# Patient Record
Sex: Female | Born: 1980 | Race: White | Hispanic: No | Marital: Single | Smoking: Never smoker
Health system: Southern US, Community
[De-identification: ages and names within clinical notes are randomized; demographics above are authoritative.]

## PROBLEM LIST (undated history)

## (undated) DIAGNOSIS — F411 Generalized anxiety disorder: Secondary | ICD-10-CM

## (undated) DIAGNOSIS — B3731 Acute candidiasis of vulva and vagina: Secondary | ICD-10-CM

## (undated) DIAGNOSIS — N3281 Overactive bladder: Secondary | ICD-10-CM

## (undated) DIAGNOSIS — I10 Essential (primary) hypertension: Secondary | ICD-10-CM

## (undated) DIAGNOSIS — G809 Cerebral palsy, unspecified: Secondary | ICD-10-CM

## (undated) DIAGNOSIS — G43909 Migraine, unspecified, not intractable, without status migrainosus: Secondary | ICD-10-CM

## (undated) DIAGNOSIS — M419 Scoliosis, unspecified: Secondary | ICD-10-CM

## (undated) DIAGNOSIS — B373 Candidiasis of vulva and vagina: Secondary | ICD-10-CM

## (undated) DIAGNOSIS — N809 Endometriosis, unspecified: Secondary | ICD-10-CM

## (undated) DIAGNOSIS — G8190 Hemiplegia, unspecified affecting unspecified side: Secondary | ICD-10-CM

## (undated) DIAGNOSIS — N3941 Urge incontinence: Secondary | ICD-10-CM

## (undated) DIAGNOSIS — F329 Major depressive disorder, single episode, unspecified: Secondary | ICD-10-CM

## (undated) DIAGNOSIS — G629 Polyneuropathy, unspecified: Secondary | ICD-10-CM

## (undated) DIAGNOSIS — R569 Unspecified convulsions: Secondary | ICD-10-CM

## (undated) DIAGNOSIS — N3000 Acute cystitis without hematuria: Secondary | ICD-10-CM

## (undated) DIAGNOSIS — E569 Vitamin deficiency, unspecified: Secondary | ICD-10-CM

## (undated) DIAGNOSIS — K5900 Constipation, unspecified: Secondary | ICD-10-CM

## (undated) DIAGNOSIS — K589 Irritable bowel syndrome without diarrhea: Secondary | ICD-10-CM

## (undated) DIAGNOSIS — G4700 Insomnia, unspecified: Secondary | ICD-10-CM

## (undated) DIAGNOSIS — IMO0001 Reserved for inherently not codable concepts without codable children: Secondary | ICD-10-CM

## (undated) DIAGNOSIS — F419 Anxiety disorder, unspecified: Secondary | ICD-10-CM

## (undated) DIAGNOSIS — F410 Panic disorder [episodic paroxysmal anxiety] without agoraphobia: Secondary | ICD-10-CM

## (undated) DIAGNOSIS — M6281 Muscle weakness (generalized): Secondary | ICD-10-CM

## (undated) DIAGNOSIS — K219 Gastro-esophageal reflux disease without esophagitis: Secondary | ICD-10-CM

## (undated) DIAGNOSIS — R339 Retention of urine, unspecified: Secondary | ICD-10-CM

## (undated) DIAGNOSIS — I639 Cerebral infarction, unspecified: Secondary | ICD-10-CM

## (undated) HISTORY — DX: Acute candidiasis of vulva and vagina: B37.31

## (undated) HISTORY — DX: Generalized anxiety disorder: F41.1

## (undated) HISTORY — DX: Essential (primary) hypertension: I10

## (undated) HISTORY — DX: Irritable bowel syndrome, unspecified: K58.9

## (undated) HISTORY — DX: Candidiasis of vulva and vagina: B37.3

## (undated) HISTORY — DX: Hemiplegia, unspecified affecting unspecified side: G81.90

## (undated) HISTORY — DX: Retention of urine, unspecified: R33.9

## (undated) HISTORY — PX: ESOPHAGOGASTRODUODENOSCOPY: SHX1529

## (undated) HISTORY — DX: Acute cystitis without hematuria: N30.00

## (undated) HISTORY — DX: Gastro-esophageal reflux disease without esophagitis: K21.9

---
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
## 2006-09-06 HISTORY — PX: OTHER SURGICAL HISTORY: SHX169

## 2014-09-06 HISTORY — PX: CORONARY ANGIOPLASTY: SHX604

## 2014-10-03 LAB — COMPREHENSIVE METABOLIC PANEL
ALT: 18 U/L (ref 14–63)
ANION GAP: 9 (ref 7–16)
Albumin: 3.7 g/dL (ref 3.4–5.0)
Alkaline Phosphatase: 102 U/L (ref 46–116)
BUN: 13 mg/dL (ref 7–18)
Bilirubin,Total: 0.4 mg/dL (ref 0.2–1.0)
CHLORIDE: 112 mmol/L — AB (ref 98–107)
CO2: 19 mmol/L — AB (ref 21–32)
CREATININE: 0.68 mg/dL (ref 0.60–1.30)
Calcium, Total: 8.6 mg/dL (ref 8.5–10.1)
EGFR (Non-African Amer.): >60
Glucose: 87 mg/dL (ref 65–99)
OSMOLALITY: 279 (ref 275–301)
POTASSIUM: 3.7 mmol/L (ref 3.5–5.1)
SGOT(AST): 10 U/L — ABNORMAL LOW (ref 15–37)
Sodium: 140 mmol/L (ref 136–145)
TOTAL PROTEIN: 7.3 g/dL (ref 6.4–8.2)

## 2014-10-03 LAB — CBC
HCT: 39.1 % (ref 35.0–47.0)
HGB: 13 g/dL (ref 12.0–16.0)
MCH: 28.6 pg (ref 26.0–34.0)
MCHC: 33.1 g/dL (ref 32.0–36.0)
MCV: 86 fL (ref 80–100)
PLATELETS: 248 10*3/uL (ref 150–440)
RBC: 4.53 10*6/uL (ref 3.80–5.20)
RDW: 14.4 % (ref 11.5–14.5)
WBC: 12.3 10*3/uL — ABNORMAL HIGH (ref 3.6–11.0)

## 2014-10-03 LAB — CK TOTAL AND CKMB (NOT AT ARMC)
CK, Total: 30 U/L (ref 26–192)
CK-MB: <0.5 ng/mL — ABNORMAL LOW (ref 0.5–3.6)

## 2014-10-03 LAB — TROPONIN I: Troponin-I: <0.02 ng/mL

## 2014-10-04 LAB — BASIC METABOLIC PANEL
Anion Gap: 9 (ref 7–16)
BUN: 11 mg/dL (ref 7–18)
Calcium, Total: 8.7 mg/dL (ref 8.5–10.1)
Chloride: 115 mmol/L — ABNORMAL HIGH (ref 98–107)
Co2: 16 mmol/L — ABNORMAL LOW (ref 21–32)
Creatinine: 0.48 mg/dL — ABNORMAL LOW (ref 0.60–1.30)
EGFR (African American): >60
GLUCOSE: 118 mg/dL — AB (ref 65–99)
Osmolality: 280 (ref 275–301)
Potassium: 4.2 mmol/L (ref 3.5–5.1)
SODIUM: 140 mmol/L (ref 136–145)

## 2014-10-04 LAB — TROPONIN I
Troponin-I: <0.02 ng/mL
Troponin-I: <0.02 ng/mL

## 2014-10-04 LAB — CBC WITH DIFFERENTIAL/PLATELET
COMMENT - H1-COM1: NORMAL
COMMENT - H1-COM2: NORMAL
HCT: 39 % (ref 35.0–47.0)
HGB: 12.9 g/dL (ref 12.0–16.0)
Lymphocytes: 11 %
MCH: 28.6 pg (ref 26.0–34.0)
MCHC: 33.1 g/dL (ref 32.0–36.0)
MCV: 86 fL (ref 80–100)
MONOS PCT: 4 %
Platelet: 212 10*3/uL (ref 150–440)
RBC: 4.52 10*6/uL (ref 3.80–5.20)
RDW: 13.8 % (ref 11.5–14.5)
Segmented Neutrophils: 85 %
WBC: 9.5 10*3/uL (ref 3.6–11.0)

## 2014-10-04 LAB — LIPID PANEL
CHOLESTEROL: 170 mg/dL (ref 0–200)
HDL Cholesterol: 36 mg/dL — ABNORMAL LOW (ref 40–60)
LDL CHOLESTEROL, CALC: 123 mg/dL — AB (ref 0–100)
TRIGLYCERIDES: 56 mg/dL (ref 0–200)
VLDL CHOLESTEROL, CALC: 11 mg/dL (ref 5–40)

## 2014-10-04 LAB — HCG, QUANTITATIVE, PREGNANCY: Beta Hcg, Quant.: <1 m[IU]/mL — ABNORMAL LOW

## 2014-10-06 ENCOUNTER — Inpatient Hospital Stay: Payer: Self-pay | Admitting: Internal Medicine

## 2014-10-07 LAB — HCG, QUANTITATIVE, PREGNANCY

## 2014-11-19 ENCOUNTER — Ambulatory Visit: Payer: Self-pay | Admitting: Gastroenterology

## 2014-12-07 ENCOUNTER — Other Ambulatory Visit: Admit: 2014-12-07 | Disposition: A | Discharge status: Home / Self Care | Payer: Self-pay

## 2014-12-07 LAB — COMPREHENSIVE METABOLIC PANEL
ALBUMIN: 4.1 g/dL
ANION GAP: 9 (ref 7–16)
Alkaline Phosphatase: 102 U/L
BILIRUBIN TOTAL: 0.4 mg/dL
BUN: 8 mg/dL
CALCIUM: 9.2 mg/dL
CHLORIDE: 112 mmol/L — AB
CO2: 19 mmol/L — AB
CREATININE: 0.5 mg/dL
EGFR (African American): >60
EGFR (Non-African Amer.): >60
GLUCOSE: 87 mg/dL
Potassium: 4 mmol/L
SGOT(AST): 16 U/L
SGPT (ALT): 13 U/L — ABNORMAL LOW
SODIUM: 140 mmol/L
Total Protein: 7.4 g/dL

## 2014-12-07 LAB — CBC WITH DIFFERENTIAL/PLATELET
BASOS PCT: 1 %
Basophil #: 0.1 10*3/uL (ref 0.0–0.1)
EOS PCT: 2.3 %
Eosinophil #: 0.2 10*3/uL (ref 0.0–0.7)
HCT: 42.7 % (ref 35.0–47.0)
HGB: 14.1 g/dL (ref 12.0–16.0)
LYMPHS PCT: 32.1 %
Lymphocyte #: 2.7 10*3/uL (ref 1.0–3.6)
MCH: 29.4 pg (ref 26.0–34.0)
MCHC: 32.9 g/dL (ref 32.0–36.0)
MCV: 89 fL (ref 80–100)
Monocyte #: 0.4 x10 3/mm (ref 0.2–0.9)
Monocyte %: 4.5 %
Neutrophil #: 5 10*3/uL (ref 1.4–6.5)
Neutrophil %: 60.1 %
Platelet: 177 10*3/uL (ref 150–440)
RBC: 4.79 10*6/uL (ref 3.80–5.20)
RDW: 13.9 % (ref 11.5–14.5)
WBC: 8.4 10*3/uL (ref 3.6–11.0)

## 2014-12-07 LAB — MAGNESIUM: Magnesium: 2.3 mg/dL

## 2014-12-07 LAB — IRON: IRON: 52 ug/dL

## 2014-12-07 LAB — TSH: Thyroid Stimulating Horm: 0.865 u[IU]/mL

## 2014-12-07 LAB — FOLATE: Folic Acid: 24 ng/mL

## 2014-12-30 LAB — SURGICAL PATHOLOGY

## 2015-01-05 NOTE — Discharge Summary (Signed)
PATIENT NAME:  Amanda Clay, Amanda Clay MR#:  [id number] DATE OF BIRTH:  06/05/1981  DATE OF ADMISSION:  10/06/2014 DATE OF DISCHARGE:  10/07/2014  ADMITTING DIAGNOSIS: Chest pain.   DISCHARGE DIAGNOSES: 1. Chest pain, felt to be possibly anxiety-induced, abnormal stress test with subsequent cardiac catheterization being negative.  2. Previous history of cerebrovascular accident.  3. Cerebral palsy.  4. Anxiety.  5. Depression.  6. History of pericarditis in the past.  7. Status post leg muscle lengthening surgery in the past.   CONSULTANTS: Dr. Adrian BlackwaterShaukat Khan.  PERTINENT LABORATORIES:  AND EVALUATIONS: Admitting glucose 87, BUN 13, creatinine 0.68, sodium 140, potassium 3.7, chloride 112, calcium 8.6.   LFTs are normal except AST of 11.   Troponin less than 0.02 x 3.   WBC 12.3, hemoglobin 13, platelet count 248,000.   Cardiac catheterization shows normal coronaries, normal LVEF.   Echocardiogram showed EF normal, impaired relaxation pattern of the left ventricular diastolic filling, pericardial effusion is trivial.   HOSPITAL COURSE: Please refer to the history and physical done by the admitting physician. The patient is a 34 year old, white female, with previous history of cerebral palsy, who presented with complaint of chest pain. The patient was evaluated in the ED and had a CT per PE protocol, which was negative for PE. Her EKG was nonrevealing. Her cardiac enzymes were nonrevealing. She underwent a stress test, which was abnormal; therefore, underwent a cardiac catheterization today, which was negative. We feel that her chest pain is possibly anxiety driven. She also needs a GI work-up for any GI-related causes as an outpatient. At this time, she is stable for discharge.   DISCHARGE MEDICATIONS: Nortriptyline 10 mg 2 tablets at bedtime, Topamax 75 mg at bedtime, Topamax 50 mg every morning, Zofran 4 mg q.6 h. p.r.n., gabapentin 300 one tablet  p.o. t.i.d., aspirin 81 mg 1 tablet p.o. daily,  Depo-Provera 1 IM every 3 months, tramadol 50 one tablet p.o. t.i.d. as needed, MiraLax 17 grams daily, multivitamin 1 tablet p.o. daily, Vesicare 10 daily, cyclobenzaprine 10 mg every morning as needed, propranolol 20 mg 1 tablet p.o. b.i.d., sertraline 100 mg 1 tablet p.o. b.i.d., clonazepam 0.5 at bedtime as needed, melatonin 5 mg 1 tablet p.o. at bedtime, Percocet 5/325 q.6 h. p.r.n. for pain, Prilosec 20 mg 1 tablet p.o. b.i.d.   DIET: Regular.   ACTIVITY: As tolerated.   FOLLOW-UP: With primary M.D. in 1 to 2 weeks. Follow up with GI, Dr. Servando SnareWohl, for a possible EGD.   TIME SPENT: 35 minutes.     ____________________________ Amanda ScottsShreyang H. Allena KatzPatel, MD shp:JT D: 10/07/2014 11:39:15 ET T: 10/07/2014 12:08:11 ET JOB#: [id number]  cc: Amanda Maresh H. Allena KatzPatel, MD, <Dictator>  Charise CarwinSHREYANG H Jaxston Chohan MD ELECTRONICALLY SIGNED 10/10/2014 12:36

## 2015-01-05 NOTE — H&P (Signed)
PATIENT NAME:  Amanda Clay, Amanda Clay MR#:  [id number] DATE OF BIRTH:  November 03, 1980  DATE OF ADMISSION:  10/03/2014  PRIMARY CARE PHYSICIAN: Dr. Fredrik Cove at [facility].  CHIEF COMPLAINT: Chest pressure.   HISTORY OF PRESENT ILLNESS: This is a 34 year old female with cerebral palsy, stroke with left-sided weakness. She does not walk around much. She presents with chest tightness at rest, not getting better, 6/10 in intensity, center of the chest, worse with a deep breath. Positive for nausea but no diaphoresis. Hospitalist services were contacted for further evaluation.   PAST MEDICAL HISTORY: Cerebral palsy, CVA with left-sided weakness, anxiety, depression, history of pericarditis in the past.   PAST SURGICAL HISTORY: Leg muscle lengthening surgery.   ALLERGIES: TO SULFA.   MEDICATIONS: As per prescription writer include: Aspirin 81 mg daily, clonazepam 0.5 mg once a day at bedtime, cyclobenzaprine 10 mg in the morning, Depo-Provera injection every 3 months, gabapentin 300 mg 3 times a day, melatonin 5 mg at bedtime, MiraLax 17 grams once a day, multivitamin 1 tablet daily, nortriptyline 10 mg 2 capsules at bedtime, Percocet 5/325 one tablet every 6 hours as needed for pain, Prilosec 20 mg once a day, propranolol 20 mg twice a day, Zoloft 100 mg twice a day, Topamax 25 mg and 50 mg total of 75 mg at night and 50 mg during the day, tramadol 50 mg 3 times a day, VESIcare 10 mg once a day, Zofran 4 mg every 6 hours as needed for nausea.   SOCIAL HISTORY: Currently at [facility]. No smoking. No alcohol. No drug use. Not working.   FAMILY HISTORY: Father died of lung cancer metastatic to bone; also had an MI. Mother is a smoker.   REVIEW OF SYSTEMS:  CONSTITUTIONAL: Positive for hot feeling. No fever or chills. No weakness, but from her stroke does have left-sided weakness. No weight loss. No weight gain.  EYES: She does wear glasses.  EARS, NOSE, MOUTH AND THROAT: No hearing loss. No sore  throat. No difficulty swallowing.  CARDIOVASCULAR: Positive for chest pain. No palpitations.  RESPIRATORY: No shortness of breath. No cough. No sputum. No hemoptysis.  GASTROINTESTINAL: No nausea. No vomiting. Did have diarrhea x 2 episodes today.  GENITOURINARY: No burning on urination or hematuria.  INTEGUMENT: No rashes or eruptions.  MUSCULOSKELETAL: Positive for joint pain.  NEUROLOGIC: No fainting or blackouts.  PSYCHIATRIC: Positive for anxiety and depression.  ENDOCRINE: No thyroid problems.  HEMATOLOGIC AND LYMPHATIC: No anemia, no easy bruising or bleeding.   PHYSICAL EXAMINATION:  VITAL SIGNS: Temperature 98.2, pulse 80, respirations 18, blood pressure 85/58, pulse oximetry 99% on room air.  GENERAL: No respiratory distress.  EYES: Conjunctivae and lids normal. Pupils equal, round, and reactive to light. Extraocular muscles intact. No nystagmus.  EARS, NOSE, MOUTH AND THROAT: Tympanic membranes, no erythema. Nasal mucosa: No erythema. Throat: No erythema. No exudate seen. Lips and gums: No lesions.  NECK: No JVD. No bruits. No lymphadenopathy. No thyromegaly. No thyroid nodules palpated.  RESPIRATORY: Lungs clear to auscultation. No use of accessory muscles to breathe. No rhonchi, rales, or wheeze heard. Painful with deep breath.  CHEST WALL: Pain to palpation anterior chest wall.  CARDIOVASCULAR: S1, S2 normal. No gallops, rubs, or murmurs heard. Carotid upstroke 2+ bilaterally. No bruits. Dorsalis pedis pulses 2+ bilaterally. Trace edema of the lower extremity.  ABDOMEN: Soft, nontender. No organomegaly/splenomegaly. Normoactive bowel sounds. No masses felt.  LYMPHATIC: No lymph nodes in the neck.  MUSCULOSKELETAL: No clubbing, edema, cyanosis.  SKIN: No rashes or lesions anteriorly.  PSYCHIATRIC: The patient is oriented to person, place, and time.   LABORATORY AND RADIOLOGICAL DATA: Troponin negative. White blood cell count is 12.3, hemoglobin and hematocrit 13.0 and 39.1,  platelet count of 248,000. Glucose 87, BUN 13, creatinine 0.68, sodium 140, potassium 3.7, chloride 112, CO2 of 19, calcium 8.6. Liver function tests normal range. Chest x-ray negative.   EKG: Flipped T waves V1 through V4.   ASSESSMENT AND PLAN:  1.  Chest pain: Worse with a deep breath also worse with palpation over chest wall. Since the patient does not walk around, we will get a CT scan of the chest to rule out pulmonary embolism. This could also be chest wall syndrome, so I will give up couple doses of intravenous steroids and see if it improves by tomorrow. We will admit as an observation, get serial cardiac enzymes, put on telemetry. The patient does have a history of pericarditis, a little less likely, but I will get an echocardiogram in the a.m.  2.  Cerebral palsy: The patient able to answer all questions and very knowledgeable.  3.  Anxiety, depression: On numerous medications.  4.  Cerebrovascular accident with left-sided weakness: Does not walk around much, but hoping to get stronger so she can get back to living in assisted living.   CODE STATUS: The patient is a full code.  TIME SPENT ON ADMISSION: 55 minutes.    ____________________________ Herschell Dimesichard J. Renae GlossWieting, MD rjw:bm D: 10/03/2014 22:10:29 ET T: 10/03/2014 22:56:29 ET JOB#: [id number]  cc: Herschell Dimesichard J. Renae GlossWieting, MD, <Dictator> [facility] Salley ScarletICHARD J Cynthis Purington MD ELECTRONICALLY SIGNED 10/04/2014 14:46

## 2015-01-05 NOTE — Consult Note (Signed)
PATIENT NAME:  Amanda Clay, Amanda Clay MR#:  [id number] DATE OF BIRTH:  1980/11/16  DATE OF CONSULTATION:  10/04/2014  REFERRING PHYSICIAN:   CONSULTING PHYSICIAN:  Laurier NancyShaukat A. Nealy Hickmon, MD  INDICATION FOR CONSULTATION: Chest pain, abnormal stress test.   HISTORY OF PRESENT ILLNESS: This is a 34 year old white female with a past medical history of cerebral palsy, history of CVA with left-sided weakness recently. She had a cardiac catheterization apparently she says in [place], she was told that she had a blockage, she does not know the details. She presented now with chest pain 6 out of 10, associated with shortness of breath and diaphoresis. She underwent a Lexiscan Myoview which showed initial EKG sinus rhythm, 70 beats per minute with nonspecific T wave changes anterolaterally, during Lexiscan infusion her heart rate shot up to 151 with 1.5 mm horizontal ST depression inferolaterally. Nuclear scan showed ejection fraction 41%, mild anterior wall reversible defect. I came to see the patient. She is still having intermittent chest.  I discussed with Dr. Allena Katzpatel  it was decided that we will keep the patient and schedule her for cardiac catheterization Monday.   PAST MEDICAL HISTORY: History of cerebral palsy, history of CVA with left-sided weakness, anxiety, depression, history of pericarditis,   ALLERGIES: SULFA.   SOCIAL HISTORY: No history of EtOH abuse or smoking.   FAMILY HISTORY: Positive for cancer and an MI and coronary artery disease in the parents.   PHYSICAL EXAMINATION:  GENERAL: She is alert and oriented x 3, in no acute distress right now.  VITAL SIGNS: Stable.  HEENT: No JVD.  LUNGS: Clear.  HEART: Regular rate and rhythm. Normal S1, S2. No audible murmur.  ABDOMEN: Soft, nontender, positive bowel sounds.  EXTREMITIES: No pedal edema.  NEUROLOGIC: She appears to be intact.   EKG shows normal sinus rhythm.   LABORATORY DATA: Shows BUN of 11, creatinine 0.48. Troponin is negative.  HCG is negative.   ASSESSMENT AND PLAN: The patient has atypical chest pain, however had stroke in the past and probably is high risk even though she is 34. Stress tests, the scan was low to moderate risk with mild anterior wall reversible defect, but the patient had significant ST depressions during Lexiscan with 1.5 mm ST depression inferolaterally, still having intermittent chest pain. It was discussed to do CCTA at the office, but it will difficult for her to go to the office with history of CVA. Advise keeping the patient over the weekend and do cardiac catheterization Monday.     ____________________________ Laurier NancyShaukat A. Loann Chahal, MD sak:bu D: 10/04/2014 14:53:15 ET T: 10/04/2014 15:21:30 ET JOB#: [id number]  cc: Laurier NancyShaukat A. Catori Panozzo, MD, <Dictator> Laurier NancySHAUKAT A Rosie Golson MD ELECTRONICALLY SIGNED 10/05/2014 11:45

## 2015-01-18 ENCOUNTER — Other Ambulatory Visit
Admission: RE | Admit: 2015-01-18 | Discharge: 2015-01-18 | Disposition: A | Discharge status: Home / Self Care | Payer: Medicaid Other | Source: Other Acute Inpatient Hospital | Attending: Physician Assistant | Admitting: Physician Assistant

## 2015-01-18 DIAGNOSIS — N3281 Overactive bladder: Secondary | ICD-10-CM | POA: Diagnosis present

## 2015-01-18 DIAGNOSIS — N3941 Urge incontinence: Secondary | ICD-10-CM | POA: Diagnosis present

## 2015-01-18 LAB — URINALYSIS COMPLETE WITH MICROSCOPIC (ARMC ONLY)
Bilirubin Urine: NEGATIVE
Glucose, UA: NEGATIVE mg/dL
Nitrite: NEGATIVE
PH: 7 (ref 5.0–8.0)
Protein, ur: 30 mg/dL — AB
Specific Gravity, Urine: 1.013 (ref 1.005–1.030)

## 2015-01-20 LAB — URINE CULTURE

## 2015-01-29 ENCOUNTER — Ambulatory Visit: Payer: Medicare Other | Attending: Neurology

## 2015-01-29 DIAGNOSIS — R0683 Snoring: Secondary | ICD-10-CM | POA: Diagnosis not present

## 2015-01-29 DIAGNOSIS — R400 Somnolence: Secondary | ICD-10-CM | POA: Diagnosis present

## 2015-03-29 ENCOUNTER — Other Ambulatory Visit
Admission: RE | Admit: 2015-03-29 | Discharge: 2015-03-29 | Disposition: A | Discharge status: Home / Self Care | Payer: Medicare Other | Source: Ambulatory Visit | Attending: Family Medicine | Admitting: Family Medicine

## 2015-03-29 DIAGNOSIS — G809 Cerebral palsy, unspecified: Secondary | ICD-10-CM | POA: Diagnosis present

## 2015-03-29 DIAGNOSIS — R319 Hematuria, unspecified: Secondary | ICD-10-CM | POA: Diagnosis not present

## 2015-03-29 DIAGNOSIS — I10 Essential (primary) hypertension: Secondary | ICD-10-CM | POA: Diagnosis present

## 2015-03-29 DIAGNOSIS — F320 Major depressive disorder, single episode, mild: Secondary | ICD-10-CM | POA: Diagnosis present

## 2015-03-29 LAB — CBC WITH DIFFERENTIAL/PLATELET
BASOS PCT: 1 %
Basophils Absolute: 0 10*3/uL (ref 0–0.1)
EOS ABS: 0 10*3/uL (ref 0–0.7)
Eosinophils Relative: 0 %
HEMATOCRIT: 34.5 % — AB (ref 35.0–47.0)
Hemoglobin: 11.5 g/dL — ABNORMAL LOW (ref 12.0–16.0)
Lymphocytes Relative: 15 %
Lymphs Abs: 1.3 10*3/uL (ref 1.0–3.6)
MCH: 29.8 pg (ref 26.0–34.0)
MCHC: 33.3 g/dL (ref 32.0–36.0)
MCV: 89.5 fL (ref 80.0–100.0)
MONO ABS: 0.8 10*3/uL (ref 0.2–0.9)
MONOS PCT: 10 %
Neutro Abs: 6.6 10*3/uL — ABNORMAL HIGH (ref 1.4–6.5)
Neutrophils Relative %: 74 %
Platelets: 185 10*3/uL (ref 150–440)
RBC: 3.85 MIL/uL (ref 3.80–5.20)
RDW: 13.1 % (ref 11.5–14.5)
WBC: 8.7 10*3/uL (ref 3.6–11.0)

## 2015-03-29 LAB — BASIC METABOLIC PANEL
ANION GAP: 7 (ref 5–15)
BUN: 14 mg/dL (ref 6–20)
CHLORIDE: 114 mmol/L — AB (ref 101–111)
CO2: 17 mmol/L — AB (ref 22–32)
Calcium: 8.4 mg/dL — ABNORMAL LOW (ref 8.9–10.3)
Creatinine, Ser: 0.7 mg/dL (ref 0.44–1.00)
GFR calc Af Amer: >60 mL/min (ref >60)
GFR calc non Af Amer: >60 mL/min (ref >60)
Glucose, Bld: 92 mg/dL (ref 65–99)
Potassium: 3.5 mmol/L (ref 3.5–5.1)
SODIUM: 138 mmol/L (ref 135–145)

## 2015-03-29 LAB — URINALYSIS COMPLETE WITH MICROSCOPIC (ARMC ONLY)
Bacteria, UA: NONE SEEN
Bilirubin Urine: NEGATIVE
Glucose, UA: NEGATIVE mg/dL
LEUKOCYTES UA: NEGATIVE
NITRITE: NEGATIVE
PH: 8 (ref 5.0–8.0)
PROTEIN: NEGATIVE mg/dL
Specific Gravity, Urine: 1.013 (ref 1.005–1.030)

## 2015-03-31 LAB — URINE CULTURE

## 2015-04-02 ENCOUNTER — Emergency Department: Payer: Medicare Other

## 2015-04-02 ENCOUNTER — Emergency Department
Admission: EM | Admit: 2015-04-02 | Discharge: 2015-04-02 | Disposition: A | Discharge status: Home / Self Care | Payer: Medicare Other | Attending: Student | Admitting: Student

## 2015-04-02 ENCOUNTER — Encounter
Admission: EM | Disposition: A | Discharge status: Home / Self Care | Payer: Self-pay | Source: Home / Self Care | Attending: Student

## 2015-04-02 ENCOUNTER — Encounter: Payer: Self-pay | Admitting: Intensive Care

## 2015-04-02 DIAGNOSIS — Z8673 Personal history of transient ischemic attack (TIA), and cerebral infarction without residual deficits: Secondary | ICD-10-CM | POA: Diagnosis not present

## 2015-04-02 DIAGNOSIS — G809 Cerebral palsy, unspecified: Secondary | ICD-10-CM | POA: Diagnosis not present

## 2015-04-02 DIAGNOSIS — R300 Dysuria: Secondary | ICD-10-CM

## 2015-04-02 DIAGNOSIS — N3281 Overactive bladder: Secondary | ICD-10-CM | POA: Diagnosis not present

## 2015-04-02 DIAGNOSIS — Z3202 Encounter for pregnancy test, result negative: Secondary | ICD-10-CM | POA: Insufficient documentation

## 2015-04-02 DIAGNOSIS — I10 Essential (primary) hypertension: Secondary | ICD-10-CM | POA: Diagnosis not present

## 2015-04-02 DIAGNOSIS — R1030 Lower abdominal pain, unspecified: Secondary | ICD-10-CM | POA: Diagnosis not present

## 2015-04-02 DIAGNOSIS — N390 Urinary tract infection, site not specified: Secondary | ICD-10-CM | POA: Diagnosis not present

## 2015-04-02 DIAGNOSIS — R319 Hematuria, unspecified: Secondary | ICD-10-CM

## 2015-04-02 DIAGNOSIS — E569 Vitamin deficiency, unspecified: Secondary | ICD-10-CM | POA: Insufficient documentation

## 2015-04-02 DIAGNOSIS — Z79899 Other long term (current) drug therapy: Secondary | ICD-10-CM | POA: Insufficient documentation

## 2015-04-02 DIAGNOSIS — F329 Major depressive disorder, single episode, unspecified: Secondary | ICD-10-CM | POA: Diagnosis not present

## 2015-04-02 HISTORY — DX: Overactive bladder: N32.81

## 2015-04-02 HISTORY — DX: Constipation, unspecified: K59.00

## 2015-04-02 HISTORY — DX: Urge incontinence: N39.41

## 2015-04-02 HISTORY — DX: Muscle weakness (generalized): M62.81

## 2015-04-02 HISTORY — DX: Cerebral infarction, unspecified: I63.9

## 2015-04-02 HISTORY — DX: Essential (primary) hypertension: I10

## 2015-04-02 HISTORY — PX: PERIPHERAL VASCULAR CATHETERIZATION: SHX172C

## 2015-04-02 HISTORY — DX: Anxiety disorder, unspecified: F41.9

## 2015-04-02 HISTORY — DX: Major depressive disorder, single episode, unspecified: F32.9

## 2015-04-02 HISTORY — DX: Migraine, unspecified, not intractable, without status migrainosus: G43.909

## 2015-04-02 HISTORY — DX: Insomnia, unspecified: G47.00

## 2015-04-02 HISTORY — DX: Vitamin deficiency, unspecified: E56.9

## 2015-04-02 HISTORY — DX: Polyneuropathy, unspecified: G62.9

## 2015-04-02 HISTORY — DX: Cerebral palsy, unspecified: G80.9

## 2015-04-02 LAB — LIPASE, BLOOD: Lipase: 15 U/L — ABNORMAL LOW (ref 22–51)

## 2015-04-02 LAB — URINALYSIS COMPLETE WITH MICROSCOPIC (ARMC ONLY)
Bilirubin Urine: NEGATIVE
Glucose, UA: NEGATIVE mg/dL
Ketones, ur: NEGATIVE mg/dL
NITRITE: NEGATIVE
Protein, ur: NEGATIVE mg/dL
Specific Gravity, Urine: 1.011 (ref 1.005–1.030)
pH: 7 (ref 5.0–8.0)

## 2015-04-02 LAB — COMPREHENSIVE METABOLIC PANEL
ALBUMIN: 3.3 g/dL — AB (ref 3.5–5.0)
ALK PHOS: 88 U/L (ref 38–126)
ALT: 12 U/L — AB (ref 14–54)
ANION GAP: 10 (ref 5–15)
AST: 18 U/L (ref 15–41)
BUN: 11 mg/dL (ref 6–20)
CALCIUM: 8.7 mg/dL — AB (ref 8.9–10.3)
CHLORIDE: 104 mmol/L (ref 101–111)
CO2: 21 mmol/L — ABNORMAL LOW (ref 22–32)
CREATININE: 0.74 mg/dL (ref 0.44–1.00)
GFR calc Af Amer: >60 mL/min (ref >60)
GFR calc non Af Amer: >60 mL/min (ref >60)
Glucose, Bld: 95 mg/dL (ref 65–99)
POTASSIUM: 3.3 mmol/L — AB (ref 3.5–5.1)
Sodium: 135 mmol/L (ref 135–145)
Total Bilirubin: 0.4 mg/dL (ref 0.3–1.2)
Total Protein: 7.7 g/dL (ref 6.5–8.1)

## 2015-04-02 LAB — CBC WITH DIFFERENTIAL/PLATELET
Basophils Absolute: 0 10*3/uL (ref 0–0.1)
Basophils Relative: 0 %
Eosinophils Absolute: 0.1 10*3/uL (ref 0–0.7)
Eosinophils Relative: 2 %
HEMATOCRIT: 35.7 % (ref 35.0–47.0)
Hemoglobin: 12 g/dL (ref 12.0–16.0)
Lymphocytes Relative: 17 %
Lymphs Abs: 1.2 10*3/uL (ref 1.0–3.6)
MCH: 29.7 pg (ref 26.0–34.0)
MCHC: 33.5 g/dL (ref 32.0–36.0)
MCV: 88.7 fL (ref 80.0–100.0)
MONOS PCT: 10 %
Monocytes Absolute: 0.7 10*3/uL (ref 0.2–0.9)
Neutro Abs: 5.1 10*3/uL (ref 1.4–6.5)
Neutrophils Relative %: 71 %
Platelets: 211 10*3/uL (ref 150–440)
RBC: 4.03 MIL/uL (ref 3.80–5.20)
RDW: 12.9 % (ref 11.5–14.5)
WBC: 7.1 10*3/uL (ref 3.6–11.0)

## 2015-04-02 LAB — PREGNANCY, URINE: Preg Test, Ur: NEGATIVE

## 2015-04-02 SURGERY — PICC LINE INSERTION
Anesthesia: LOCAL

## 2015-04-02 MED ORDER — HEPARIN SOD (PORK) LOCK FLUSH 10 UNIT/ML IV SOLN
INTRAVENOUS | Status: AC
  Filled 2015-04-02: qty 1

## 2015-04-02 MED ORDER — HEPARIN SOD (PORK) LOCK FLUSH 10 UNIT/ML IV SOLN
INTRAVENOUS | Status: AC
  Filled 2015-04-02: qty 2

## 2015-04-02 MED ORDER — HEPARIN SOD (PORK) LOCK FLUSH 100 UNIT/ML IV SOLN
INTRAVENOUS | Status: AC
  Filled 2015-04-02: qty 10

## 2015-04-02 MED ORDER — ERTAPENEM SODIUM 1 G IJ SOLR: 1.0000 g | INTRAMUSCULAR | Status: AC

## 2015-04-02 MED ORDER — LIDOCAINE HCL (PF) 1 % IJ SOLN
INTRAMUSCULAR | Status: DC | PRN
  Administered 2015-04-02: 2 mL via INTRADERMAL

## 2015-04-02 MED ORDER — SODIUM CHLORIDE 0.9 % IV SOLN
1.0000 g | Freq: Once | INTRAVENOUS | Status: AC
  Administered 2015-04-02: 1 g via INTRAVENOUS
  Filled 2015-04-02: qty 1

## 2015-04-02 MED ORDER — HEPARIN SOD (PORK) LOCK FLUSH 10 UNIT/ML IV SOLN
10.0000 [IU] | Freq: Once | INTRAVENOUS | Status: AC
  Administered 2015-04-02: 10 [IU] via INTRAVENOUS

## 2015-04-02 MED ORDER — SODIUM CHLORIDE 0.9 % IV BOLUS (SEPSIS)
500.0000 mL | Freq: Once | INTRAVENOUS | Status: AC
  Administered 2015-04-02: 500 mL via INTRAVENOUS

## 2015-04-02 SURGICAL SUPPLY — 5 items
GAUZE SPONGE 4X4 12PLY STRL (GAUZE/BANDAGES/DRESSINGS) ×2 IMPLANT
IV NS 250ML (IV SOLUTION) ×1
IV NS 250ML BAXH (IV SOLUTION) ×1 IMPLANT
KIT PICC DUAL 5FX80 (MISCELLANEOUS) ×2 IMPLANT
TOWEL OR 17X26 4PK STRL BLUE (TOWEL DISPOSABLE) ×2 IMPLANT

## 2015-04-02 NOTE — ED Notes (Addendum)
Contacted Lab to draw blood. Multiple failed attempts by RN's to get an IV started. Lab at bedside

## 2015-04-02 NOTE — ED Notes (Signed)
Patient arrived by EMS from [facility]. Group home sent patient for IV antibiotics and labs due to recurrent UTI's. Group home tried to get IV four times and then called EMS.

## 2015-04-02 NOTE — ED Notes (Signed)
PICC line placed by vascular

## 2015-04-02 NOTE — ED Notes (Signed)
Patient transported to CT 

## 2015-04-02 NOTE — ED Notes (Signed)
Patient transported to Vascular to get PICC line placed.

## 2015-04-02 NOTE — Op Note (Signed)
OPERATIVE NOTE   PROCEDURE: 1. Insertion of a PICC line with ultrasound and fluoroscopic guidance  PRE-OPERATIVE DIAGNOSIS: Urinary tract infection with antibiotic resistance organism  POST-OPERATIVE DIAGNOSIS: Same  SURGEON: Renford Dills M.D.  ANESTHESIA: Local 1% lidocaine  ESTIMATED BLOOD LOSS: Minimal  INDICATIONS:   Amanda Clay is a 34 y.o. female who presents with urinary tract infection.  DESCRIPTION: After obtaining full informed written consent, the patient was brought back to  to special procedures.  The right arm was sterilely prepped and draped, a sterile surgical field was created. The basilic vein was accessed under direct ultrasound guidance without difficulty utilizing a micropuncture needle and a 0.018 wire was then advanced into the superior vena cava. A permanent image was recorded. Peel-away sheath was then placed over the wire. A double lumen peripherally inserted central venous catheter was then advanced over the wire and the wire and peel-away sheath were removed. The catheter tip was then verified to be in the superior vena cava and the catheter was secured to the skin at 31 cm with a sterile dressing including a Biopatch.  The catheter aspirates easily and flushes without difficulty. It is packed with heparinized saline.  Patient tolerated procedure well there were no complications.  COMPLICATIONS: There were no complications  CONDITION: Unchanged  Renford Dills, M.D. [facility] [phone]   04/02/2015, 5:32 PM

## 2015-04-02 NOTE — ED Provider Notes (Signed)
[facility] Emergency Department Provider Note  ____________________________________________  Time seen: Approximately 3:04 PM  I have reviewed the triage vital signs and the nursing notes.   HISTORY  Chief Complaint Urinary Tract Infection    HPI Amanda Clay is a 34 y.o. female with cerebral palsy, history of CVA with residual left-sided weakness presents for evaluation of gradual onset dysuria/hematuria, constant for the past 3 days. Patient was being treated at [facility] with by mouth ciprofloxacin course which she has completed as well as intramuscular ceftriaxone which was started on 03/31/2015 however she reports that she is continuing to have dysuria, is now having bilateral flank pain, lower abdominal discomfort as well as nausea without vomiting. She reports that she has had fever at [facility]. Current severity of symptoms is moderate. No modifying factors. She has a history of recurrent urinary tract infections as well as hematuria. No cough, sneezing, runny nose, congestion, diarrhea.   Past Medical History  Diagnosis Date  . Cerebral palsy   . Major depressive disorder   . Hypertension   . Overactive bladder   . Insomnia   . Anxiety   . Vitamin deficiency     unspecified  . Constipation   . Muscle weakness (generalized)   . Polyneuropathy   . Migraine   . Urge incontinence   . Stroke     There are no active problems to display for this patient.   History reviewed. No pertinent past surgical history.  Current Outpatient Rx  Name  Route  Sig  Dispense  Refill  . ertapenem 1 g in sodium chloride 0.9 % 50 mL   Intravenous   Inject 1 g into the vein daily.   3 g   0     Allergies Sulfa antibiotics  History reviewed. No pertinent family history.  Social History History  Substance Use Topics  . Smoking status: Never Smoker   . Smokeless tobacco: Never Used  . Alcohol Use: No    Review of Systems Constitutional: +  fever, no chills Eyes: No visual changes. ENT: No sore throat. Cardiovascular: Denies chest pain. Respiratory: Denies shortness of breath. Gastrointestinal: + abdominal pain.  + nausea, no vomiting.  No diarrhea.  No constipation. Genitourinary: Positive for dysuria. Musculoskeletal: Positive for bilateral flank pain Skin: Negative for rash. Neurological: Negative for headaches, focal weakness or numbness.  10-point ROS otherwise negative.  ____________________________________________   PHYSICAL EXAM:  VITAL SIGNS: ED Triage Vitals  Enc Vitals Group     BP 04/02/15 1415 100/64 mmHg     Pulse Rate 04/02/15 1415 71     Resp 04/02/15 1415 16     Temp 04/02/15 1415 97.5 F (36.4 C)     Temp Source 04/02/15 1415 Oral     SpO2 04/02/15 1415 98 %     Weight 04/02/15 1415 135 lb (61.236 kg)     Height --      Head Cir --      Peak Flow --      Pain Score 04/02/15 1413 8     Pain Loc --      Pain Edu? --      Excl. in GC? --     Constitutional: Alert and oriented. Chronically ill-appearing but in no acute distress, nontoxic appearing. Eyes: Conjunctivae are normal. PERRL. EOMI. Head: Atraumatic. Nose: No congestion/rhinnorhea. Mouth/Throat: Mucous membranes are moist.  Oropharynx non-erythematous. Neck: No stridor.   Cardiovascular: Normal rate, regular rhythm. Grossly normal heart sounds.  Good peripheral circulation. Respiratory: Normal respiratory effort.  No retractions. Lungs CTAB. Gastrointestinal: Soft with mild suprapubic tenderness. No distention. No abdominal bruits. No CVA tenderness. Genitourinary: deferred Musculoskeletal: No lower extremity tenderness nor edema.  No joint effusions. Neurologic:  Normal language though slightly cognitively delayed; no dysarthria, muscle wasting of bilateral lower extremities. Chronic weakness in bilateral lower extremities, left greater than right, intact strength and sensation in the upper extremities. Skin:  Skin is warm, dry  and intact. No rash noted. Psychiatric: Mood and affect are normal. Speech and behavior are normal.  ____________________________________________   LABS (all labs ordered are listed, but only abnormal results are displayed)  Labs Reviewed  URINALYSIS COMPLETEWITH MICROSCOPIC ([facility] ONLY) - Abnormal; Notable for the following:    Color, Urine YELLOW (*)    APPearance CLOUDY (*)    Hgb urine dipstick 2+ (*)    Leukocytes, UA TRACE (*)    Bacteria, UA RARE (*)    Squamous Epithelial / LPF TOO NUMEROUS TO COUNT (*)    All other components within normal limits  COMPREHENSIVE METABOLIC PANEL - Abnormal; Notable for the following:    Potassium 3.3 (*)    CO2 21 (*)    Calcium 8.7 (*)    Albumin 3.3 (*)    ALT 12 (*)    All other components within normal limits  LIPASE, BLOOD - Abnormal; Notable for the following:    Lipase 15 (*)    All other components within normal limits  URINE CULTURE  CULTURE, BLOOD (SINGLE)  CBC WITH DIFFERENTIAL/PLATELET  PREGNANCY, URINE   ____________________________________________  EKG  none ____________________________________________  RADIOLOGY  CT abdomen and pelvis IMPRESSION: Nonobstructing left renal calculi. No hydronephrosis on either side. No ureteral calculi on either side.  No bowel obstruction. No abscess. Appendix appears normal.   ____________________________________________   PROCEDURES  Procedure(s) performed: None  Critical Care performed: No  ____________________________________________   INITIAL IMPRESSION / ASSESSMENT AND PLAN / ED COURSE  Pertinent labs & imaging results that were available during my care of the patient were reviewed by me and considered in my medical decision making (see chart for details).  Amanda Clay is a 34 y.o. female with cerebral palsy, history of CVA with residual left-sided weakness presents for evaluation of gradual onset dysuria/hematuria, constant for the past 3 days in the  setting of possible urinary tract infection which has been treated with ciprofloxacin and is being treated with intramuscular ceftriaxone. UA 03/29/15 with multiple RBCs and WBCs, Urine cultures on 03/29/15 were equivocal and recollection was recommended. She has grown E.coli in the past. According to staff at [facility], they have tried to place an IV multiple times without success. We have been unable to obtain IV as well, multiple failed attempts included ultrasound-guided IV. Given her persistent documented fevers at [facility] for the past 2 days despite appropriate antibiotics, worsening nausea, bilateral flank pain, concern for persistent urinary tract infection with possible ascending infection. Will order PICC line, obtain basic labs.  ----------------------------------------- 5:15 PM on 04/02/2015 ----------------------------------------- Labs notable for mild hypokalemia with a normal creatinine, no leukocytosis. PICC line successfully placed. Discussed with Dr. Luciana Axe of [facility] infectious disease who recommends 3 days of once daily IV Invanz. I discussed the patient's care with the PA at [facility], Vernona Rieger, as well as nursing supervisors/nursing care coordinator and all are comfortable with her returning to the facility. Additionally, the patient wants to return to [facility], does not desire inpatient admission.  Staff reports they are capable of caring for PICC lines. The PA at the facility will follow-up with the patient and she will be seen by Dr. Lorenda Hatchet in 2 days. CT scan notable for left-sided nonobstructing renal stones. Staff at the facility reported to me that they're concerned that the patient is sexually active though she denied it. They wanted her tested for sexually transmitted infections. I discussed this with the patient, she does not want to be tested or treated for any sexual transmitted infections, she does not consent to pelvic examination so we'll not  proceed.  ----------------------------------------- 6:30 PM on 04/02/2015 -----------------------------------------  Patient sitting up in bed, appears well. Mildly hypotensive but this appears to be her baseline on chart review. She received her first dose of Invanz here. We'll discharge back to [facility] as above. ____________________________________________   FINAL CLINICAL IMPRESSION(S) / ED DIAGNOSES  Final diagnoses:  Hematuria  Dysuria  UTI (lower urinary tract infection)      Gayla Doss, MD 04/02/15 [phone]

## 2015-04-03 ENCOUNTER — Encounter: Payer: Self-pay | Admitting: Vascular Surgery

## 2015-04-04 LAB — URINE CULTURE

## 2015-04-07 LAB — CULTURE, BLOOD (SINGLE): CULTURE: NO GROWTH

## 2015-04-14 ENCOUNTER — Other Ambulatory Visit
Admission: RE | Admit: 2015-04-14 | Discharge: 2015-04-14 | Disposition: A | Discharge status: Home / Self Care | Payer: Medicaid Other | Source: Ambulatory Visit | Attending: Family Medicine | Admitting: Family Medicine

## 2015-04-14 DIAGNOSIS — I10 Essential (primary) hypertension: Secondary | ICD-10-CM | POA: Diagnosis present

## 2015-04-14 DIAGNOSIS — G809 Cerebral palsy, unspecified: Secondary | ICD-10-CM | POA: Diagnosis present

## 2015-04-14 DIAGNOSIS — R079 Chest pain, unspecified: Secondary | ICD-10-CM | POA: Insufficient documentation

## 2015-04-14 DIAGNOSIS — F320 Major depressive disorder, single episode, mild: Secondary | ICD-10-CM | POA: Diagnosis present

## 2015-04-14 LAB — BASIC METABOLIC PANEL
Anion gap: 5 (ref 5–15)
BUN: 10 mg/dL (ref 6–20)
CO2: 22 mmol/L (ref 22–32)
Calcium: 9 mg/dL (ref 8.9–10.3)
Chloride: 110 mmol/L (ref 101–111)
Creatinine, Ser: 0.48 mg/dL (ref 0.44–1.00)
GFR calc Af Amer: >60 mL/min (ref >60)
GFR calc non Af Amer: >60 mL/min (ref >60)
Glucose, Bld: 88 mg/dL (ref 65–99)
Potassium: 4 mmol/L (ref 3.5–5.1)
Sodium: 137 mmol/L (ref 135–145)

## 2015-04-14 LAB — CBC WITH DIFFERENTIAL/PLATELET
BASOS ABS: 0 10*3/uL (ref 0–0.1)
Basophils Relative: 1 %
EOS PCT: 3 %
Eosinophils Absolute: 0.2 10*3/uL (ref 0–0.7)
HCT: 34.4 % — ABNORMAL LOW (ref 35.0–47.0)
Hemoglobin: 11.3 g/dL — ABNORMAL LOW (ref 12.0–16.0)
LYMPHS PCT: 33 %
Lymphs Abs: 2.1 10*3/uL (ref 1.0–3.6)
MCH: 29.2 pg (ref 26.0–34.0)
MCHC: 32.7 g/dL (ref 32.0–36.0)
MCV: 89.3 fL (ref 80.0–100.0)
MONO ABS: 0.3 10*3/uL (ref 0.2–0.9)
Monocytes Relative: 5 %
Neutro Abs: 3.6 10*3/uL (ref 1.4–6.5)
Neutrophils Relative %: 58 %
Platelets: 299 10*3/uL (ref 150–440)
RBC: 3.85 MIL/uL (ref 3.80–5.20)
RDW: 13.2 % (ref 11.5–14.5)
WBC: 6.2 10*3/uL (ref 3.6–11.0)

## 2015-04-15 ENCOUNTER — Emergency Department: Payer: Medicare Other

## 2015-04-15 ENCOUNTER — Emergency Department
Admission: EM | Admit: 2015-04-15 | Discharge: 2015-04-15 | Disposition: A | Discharge status: Home / Self Care | Payer: Medicare Other | Attending: Emergency Medicine | Admitting: Emergency Medicine

## 2015-04-15 DIAGNOSIS — I10 Essential (primary) hypertension: Secondary | ICD-10-CM | POA: Insufficient documentation

## 2015-04-15 DIAGNOSIS — R079 Chest pain, unspecified: Secondary | ICD-10-CM | POA: Diagnosis not present

## 2015-04-15 DIAGNOSIS — Z79899 Other long term (current) drug therapy: Secondary | ICD-10-CM | POA: Diagnosis not present

## 2015-04-15 DIAGNOSIS — R519 Headache, unspecified: Secondary | ICD-10-CM

## 2015-04-15 DIAGNOSIS — R110 Nausea: Secondary | ICD-10-CM | POA: Insufficient documentation

## 2015-04-15 DIAGNOSIS — R51 Headache: Secondary | ICD-10-CM | POA: Diagnosis not present

## 2015-04-15 DIAGNOSIS — Z7982 Long term (current) use of aspirin: Secondary | ICD-10-CM | POA: Diagnosis not present

## 2015-04-15 LAB — URINALYSIS COMPLETE WITH MICROSCOPIC (ARMC ONLY)
BILIRUBIN URINE: NEGATIVE
GLUCOSE, UA: NEGATIVE mg/dL
HGB URINE DIPSTICK: NEGATIVE
Ketones, ur: NEGATIVE mg/dL
NITRITE: NEGATIVE
Protein, ur: NEGATIVE mg/dL
Specific Gravity, Urine: 1.011 (ref 1.005–1.030)
pH: 8 (ref 5.0–8.0)

## 2015-04-15 LAB — CBC
HCT: 32.5 % — ABNORMAL LOW (ref 35.0–47.0)
Hemoglobin: 10.7 g/dL — ABNORMAL LOW (ref 12.0–16.0)
MCH: 29.4 pg (ref 26.0–34.0)
MCHC: 32.8 g/dL (ref 32.0–36.0)
MCV: 89.5 fL (ref 80.0–100.0)
PLATELETS: 287 10*3/uL (ref 150–440)
RBC: 3.63 MIL/uL — ABNORMAL LOW (ref 3.80–5.20)
RDW: 13.5 % (ref 11.5–14.5)
WBC: 7.2 10*3/uL (ref 3.6–11.0)

## 2015-04-15 LAB — FIBRIN DERIVATIVES D-DIMER (ARMC ONLY): Fibrin derivatives D-dimer (ARMC): 1189 — ABNORMAL HIGH (ref 0–499)

## 2015-04-15 LAB — BASIC METABOLIC PANEL
ANION GAP: 5 (ref 5–15)
BUN: 10 mg/dL (ref 6–20)
CALCIUM: 8.4 mg/dL — AB (ref 8.9–10.3)
CO2: 22 mmol/L (ref 22–32)
CREATININE: 0.59 mg/dL (ref 0.44–1.00)
Chloride: 115 mmol/L — ABNORMAL HIGH (ref 101–111)
GFR calc Af Amer: >60 mL/min (ref >60)
Glucose, Bld: 101 mg/dL — ABNORMAL HIGH (ref 65–99)
Potassium: 3.9 mmol/L (ref 3.5–5.1)
Sodium: 142 mmol/L (ref 135–145)

## 2015-04-15 LAB — TROPONIN I

## 2015-04-15 MED ORDER — BUTALBITAL-APAP-CAFFEINE 50-325-40 MG PO TABS
1.0000 | ORAL_TABLET | Freq: Once | ORAL | Status: AC
  Administered 2015-04-15: 1 via ORAL
  Filled 2015-04-15: qty 1

## 2015-04-15 MED ORDER — IOHEXOL 350 MG/ML SOLN
75.0000 mL | Freq: Once | INTRAVENOUS | Status: AC | PRN
  Administered 2015-04-15: 75 mL via INTRAVENOUS

## 2015-04-15 NOTE — ED Notes (Signed)
Pt resting in stretcher , no distress, awaken easily to verbal stimuli. Pt resides at [facility]

## 2015-04-15 NOTE — ED Notes (Signed)
Pt talking on cell phone.  nsr on monitor.  No acute distress.

## 2015-04-15 NOTE — ED Notes (Signed)
According to EMS, pt has been experencing chest pain and nausea intermittently throuout the day. Pt was given x2 rounds of mylanta and zofran with no relief. Pt arrives to ED A+OX4, 6/10 Chest Pain, in no acute distress.

## 2015-04-15 NOTE — ED Provider Notes (Signed)
[facility] Provider Note  ____________________________________________  Time seen: Approximately 0054 AM  I have reviewed the triage vital signs and the nursing notes.   HISTORY  Chief Complaint Chest Pain and Nausea    HPI Amanda Clay is a 34 y.o. female who comes in today with chest pain. The patient has a history of stroke 1 year ago and is living at an assisted living facility. She reports that her chest has been hurting on and off for multiple days. She reports that her head started hurting this morning. The patient had a PICC line placed suddenly to have a urinary tract infection treated. The patient reports that she's been having some pain and tightness and pulling especially associated with the PICC line. She reports that she was told it may be do to reflux and that the PICC should not hurt. She reports that today she woke up with her head hurting a lot and feeling as though the blood was rushing to her head. She reports that she would feel woozy and feel funky as well. She reports whenever the headache was worse the chest pain would be worse. The patient reports that she thinks something has to be wrong with her having these continued symptoms. She reports the pain is in the center of her chest and radiates towards the left. She reports it is mildly worse with breathing. She reports that her blood pressure was low today and she was given some fluids. Her chest pain is a 6 out of 10 in intensity and her headache as a 5 out of 10 in intensity.    Past Medical History  Diagnosis Date  . Cerebral palsy   . Major depressive disorder   . Hypertension   . Overactive bladder   . Insomnia   . Anxiety   . Vitamin deficiency     unspecified  . Constipation   . Muscle weakness (generalized)   . Polyneuropathy   . Migraine   . Urge incontinence   . Stroke     There are no active problems to display for this patient.   Past Surgical  History  Procedure Laterality Date  . Peripheral vascular catheterization N/A 04/02/2015    Procedure: PICC Line Insertion;  Surgeon: Renford Dills, MD;  Location: [facility] INVASIVE CV LAB;  Service: Cardiovascular;  Laterality: N/A;    Current Outpatient Rx  Name  Route  Sig  Dispense  Refill  . aspirin 81 MG chewable tablet   Oral   Chew 1 tablet by mouth daily.         . busPIRone (BUSPAR) 15 MG tablet   Oral   Take 1 tablet by mouth every morning.         . clonazePAM (KLONOPIN) 0.5 MG tablet   Oral   Take 1 tablet by mouth every evening.         . Cyanocobalamin (RA VITAMIN B-12 TR) 1000 MCG TBCR   Oral   Take 1 tablet by mouth daily.         . cyclobenzaprine (FLEXERIL) 10 MG tablet   Oral   Take 1 tablet by mouth 3 (three) times daily as needed.         Marland Kitchen dexlansoprazole (DEXILANT) 60 MG capsule   Oral   Take 1 capsule by mouth daily.         Marland Kitchen docusate sodium (COLACE) 100 MG capsule   Oral   Take 1 capsule by mouth 2 (  two) times daily.         Marland Kitchen gabapentin (NEURONTIN) 300 MG capsule   Oral   Take 1 capsule by mouth 3 (three) times daily.         . medroxyPROGESTERone (DEPO-PROVERA) 150 MG/ML injection   Intramuscular   Inject 1 mL into the muscle every 3 (three) months.         . modafinil (PROVIGIL) 100 MG tablet   Oral   Take 1 tablet by mouth daily.         . Multiple Vitamin (MULTI-VITAMINS) TABS   Oral   Take 1 tablet by mouth daily.         . nortriptyline (PAMELOR) 50 MG capsule   Oral   Take 1 capsule by mouth every evening.         Marland Kitchen omeprazole (PRILOSEC) 20 MG capsule   Oral   Take 1 capsule by mouth daily.         . ondansetron (ZOFRAN) 4 MG tablet   Oral   Take 1 tablet by mouth every 8 (eight) hours as needed.         Marland Kitchen oxyCODONE-acetaminophen (PERCOCET/ROXICET) 5-325 MG per tablet   Oral   Take 1 tablet by mouth every 6 (six) hours as needed.         . polyethylene glycol (MIRALAX / GLYCOLAX)  packet   Oral   Take 1 packet by mouth daily.         . propranolol (INDERAL) 20 MG tablet   Oral   Take 1 tablet by mouth 2 (two) times daily.         . sertraline (ZOLOFT) 100 MG tablet   Oral   Take 1 tablet by mouth 2 (two) times daily.         . solifenacin (VESICARE) 10 MG tablet   Oral   Take 1 tablet by mouth daily.         Marland Kitchen topiramate (TOPAMAX) 25 MG tablet   Oral   Take 1 tablet by mouth daily.         Marland Kitchen topiramate (TOPAMAX) 50 MG tablet   Oral   Take 1 tablet by mouth 2 (two) times daily.         . traMADol (ULTRAM) 50 MG tablet   Oral   Take 1 tablet by mouth every 6 (six) hours as needed.         . busPIRone (BUSPAR) 10 MG tablet   Oral   Take 1 tablet by mouth every evening.           Allergies Sulfa antibiotics  History reviewed. No pertinent family history.  Social History History  Substance Use Topics  . Smoking status: Never Smoker   . Smokeless tobacco: Never Used  . Alcohol Use: No    Review of Systems Constitutional: No fever/chills Eyes: No visual changes. ENT: No sore throat. Cardiovascular:  chest pain. Respiratory:  shortness of breath. Gastrointestinal: No abdominal pain.  No nausea, no vomiting.  No diarrhea.  No constipation. Genitourinary: Negative for dysuria. Musculoskeletal: Negative for back pain. Skin: Negative for rash. Neurological: Headache  10-point ROS otherwise negative.  ____________________________________________   PHYSICAL EXAM:  VITAL SIGNS: ED Triage Vitals  Enc Vitals Group     BP 04/15/15 0011 107/62 mmHg     Pulse Rate 04/15/15 0011 67     Resp 04/15/15 0011 21     Temp 04/15/15 0011 97.9 F (36.6 C)  Temp Source 04/15/15 0011 Oral     SpO2 04/15/15 0011 100 %     Weight --      Height --      Head Cir --      Peak Flow --      Pain Score 04/15/15 0012 6     Pain Loc --      Pain Edu? --      Excl. in GC? --     Constitutional: Alert and oriented. Well appearing  and in mild distress. Eyes: Conjunctivae are normal. PERRL. EOMI. Head: Atraumatic. Nose: No congestion/rhinnorhea. Mouth/Throat: Mucous membranes are moist.  Oropharynx non-erythematous. Cardiovascular: Normal rate, regular rhythm. Grossly normal heart sounds.  Good peripheral circulation. Respiratory: Normal respiratory effort.  No retractions. Lungs CTAB. Gastrointestinal: Soft and nontender. No distention. Positive bowel sounds Musculoskeletal: No lower extremity tenderness nor edema.   Neurologic:  Normal speech and language.  Skin:  Skin is warm, dry and intact. No rash noted. Psychiatric: Mood and affect are normal.   ____________________________________________   LABS (all labs ordered are listed, but only abnormal results are displayed)  Labs Reviewed  BASIC METABOLIC PANEL - Abnormal; Notable for the following:    Chloride 115 (*)    Glucose, Bld 101 (*)    Calcium 8.4 (*)    All other components within normal limits  CBC - Abnormal; Notable for the following:    RBC 3.63 (*)    Hemoglobin 10.7 (*)    HCT 32.5 (*)    All other components within normal limits  URINALYSIS COMPLETEWITH MICROSCOPIC ([facility] ONLY) - Abnormal; Notable for the following:    Color, Urine YELLOW (*)    APPearance TURBID (*)    Leukocytes, UA 1+ (*)    Bacteria, UA FEW (*)    Squamous Epithelial / LPF 6-30 (*)    All other components within normal limits  FIBRIN DERIVATIVES D-DIMER ([facility] ONLY) - Abnormal; Notable for the following:    Fibrin derivatives D-dimer [facility]) 1189 (*)    All other components within normal limits  TROPONIN I  TROPONIN I   ____________________________________________  EKG  ED ECG REPORT I, Rebecka Apley, the attending physician, personally viewed and interpreted this ECG.   Date: 04/15/2015  EKG Time: 0015  Rate: 65  Rhythm: normal sinus rhythm, nonspecific ST and T waves changes  Axis: Normal  Intervals:none  ST&T Change: ST depressions in lead 1 and  aVL also flipped T waves in leads V2 - V6  ____________________________________________  RADIOLOGY  Chest x-ray: Minimal left basilar atelectasis noted otherwise lungs clear CT head: Study within normal limits CT angiography chest: No evidence of pulmonary embolus, left minimal basilar atelectasis or scarring noted. ____________________________________________   PROCEDURES  Procedure(s) performed: None  Critical Care performed: No  ____________________________________________   INITIAL IMPRESSION / ASSESSMENT AND PLAN / ED COURSE  Pertinent labs & imaging results that were available during my care of the patient were reviewed by me and considered in my medical decision making (see chart for details).  This is a 34 year old female with a history of stroke who comes in today with headache and chest pain. The patient does have a PICC line in her right arm. We'll check some blood work to include a d-dimer for clot evaluation as well as a CT scan. The patient will receive a dose of Fioricet and will be reassessed once her results have been received.  The patient's workup including her CT chest is unremarkable.  At this point I do not have a good cause for the patient's chest pain. I will discharge the patient to her nursing home and have her follow back up with her primary care physician. The patient has been resting comfortably while in the emergency department. ____________________________________________   FINAL CLINICAL IMPRESSION(S) / ED DIAGNOSES  Final diagnoses:  Chest pain, unspecified chest pain type  Acute nonintractable headache, unspecified headache type      Rebecka Apley, MD 04/15/15 [phone]

## 2015-04-15 NOTE — Discharge Instructions (Signed)
Chest Pain (Nonspecific) °It is often hard to give a specific diagnosis for the cause of chest pain. There is always a chance that your pain could be related to something serious, such as a heart attack or a blood clot in the lungs. You need to follow up with your health care provider for further evaluation. °CAUSES  °· Heartburn. °· Pneumonia or bronchitis. °· Anxiety or stress. °· Inflammation around your heart (pericarditis) or lung (pleuritis or pleurisy). °· A blood clot in the lung. °· A collapsed lung (pneumothorax). It can develop suddenly on its own (spontaneous pneumothorax) or from trauma to the chest. °· Shingles infection (herpes zoster virus). °The chest wall is composed of bones, muscles, and cartilage. Any of these can be the source of the pain. °· The bones can be bruised by injury. °· The muscles or cartilage can be strained by coughing or overwork. °· The cartilage can be affected by inflammation and become sore (costochondritis). °DIAGNOSIS  °Lab tests or other studies may be needed to find the cause of your pain. Your health care provider may have you take a test called an ambulatory electrocardiogram (ECG). An ECG records your heartbeat patterns over a 24-hour period. You may also have other tests, such as: °· Transthoracic echocardiogram (TTE). During echocardiography, sound waves are used to evaluate how blood flows through your heart. °· Transesophageal echocardiogram (TEE). °· Cardiac monitoring. This allows your health care provider to monitor your heart rate and rhythm in real time. °· Holter monitor. This is a portable device that records your heartbeat and can help diagnose heart arrhythmias. It allows your health care provider to track your heart activity for several days, if needed. °· Stress tests by exercise or by giving medicine that makes the heart beat faster. °TREATMENT  °· Treatment depends on what may be causing your chest pain. Treatment may include: °¨ Acid blockers for  heartburn. °¨ Anti-inflammatory medicine. °¨ Pain medicine for inflammatory conditions. °¨ Antibiotics if an infection is present. °· You may be advised to change lifestyle habits. This includes stopping smoking and avoiding alcohol, caffeine, and chocolate. °· You may be advised to keep your head raised (elevated) when sleeping. This reduces the chance of acid going backward from your stomach into your esophagus. °Most of the time, nonspecific chest pain will improve within 2-3 days with rest and mild pain medicine.  °HOME CARE INSTRUCTIONS  °· If antibiotics were prescribed, take them as directed. Finish them even if you start to feel better. °· For the next few days, avoid physical activities that bring on chest pain. Continue physical activities as directed. °· Do not use any tobacco products, including cigarettes, chewing tobacco, or electronic cigarettes. °· Avoid drinking alcohol. °· Only take medicine as directed by your health care provider. °· Follow your health care provider's suggestions for further testing if your chest pain does not go away. °· Keep any follow-up appointments you made. If you do not go to an appointment, you could develop lasting (chronic) problems with pain. If there is any problem keeping an appointment, call to reschedule. °SEEK MEDICAL CARE IF:  °· Your chest pain does not go away, even after treatment. °· You have a rash with blisters on your chest. °· You have a fever. °SEEK IMMEDIATE MEDICAL CARE IF:  °· You have increased chest pain or pain that spreads to your arm, neck, jaw, back, or abdomen. °· You have shortness of breath. °· You have an increasing cough, or you cough   up blood. °· You have severe back or abdominal pain. °· You feel nauseous or vomit. °· You have severe weakness. °· You faint. °· You have chills. °This is an emergency. Do not wait to see if the pain will go away. Get medical help at once. Call your local emergency services (911 in [place] Do not drive  yourself to the hospital. °MAKE SURE YOU:  °· Understand these instructions. °· Will watch your condition. °· Will get help right away if you are not doing well or get worse. °Document Released: 06/02/2005 Document Revised: 08/28/2013 Document Reviewed: 03/28/2008 °[employer]® Patient Information ©2015 [employer]. This information is not intended to replace advice given to you by your health care provider. Make sure you discuss any questions you have with your health care provider. ° °General Headache Without Cause °A general headache is pain or discomfort felt around the head or neck area. The cause may not be found.  °HOME CARE  °· Keep all doctor visits. °· Only take medicines as told by your doctor. °· Lie down in a dark, quiet room when you have a headache. °· Keep a journal to find out if certain things bring on headaches. For example, write down: °¨ What you eat and drink. °¨ How much sleep you get. °¨ Any change to your diet or medicines. °· Relax by getting a massage or doing other relaxing activities. °· Put ice or heat packs on the head and neck area as told by your doctor. °· Lessen stress. °· Sit up straight. Do not tighten (tense) your muscles. °· Quit smoking if you smoke. °· Lessen how much alcohol you drink. °· Lessen how much caffeine you drink, or stop drinking caffeine. °· Eat and sleep on a regular schedule. °· Get 7 to 9 hours of sleep, or as told by your doctor. °· Keep lights dim if bright lights bother you or make your headaches worse. °GET HELP RIGHT AWAY IF:  °· Your headache becomes really bad. °· You have a fever. °· You have a stiff neck. °· You have trouble seeing. °· Your muscles are weak, or you lose muscle control. °· You lose your balance or have trouble walking. °· You feel like you will pass out (faint), or you pass out. °· You have really bad symptoms that are different than your first symptoms. °· You have problems with the medicines given to you by your doctor. °· Your  medicines do not work. °· Your headache feels different than the other headaches. °· You feel sick to your stomach (nauseous) or throw up (vomit). °MAKE SURE YOU:  °· Understand these instructions. °· Will watch your condition. °· Will get help right away if you are not doing well or get worse. °Document Released: 06/01/2008 Document Revised: 11/15/2011 Document Reviewed: 08/13/2011 °[employer]® Patient Information ©2015 [employer]. This information is not intended to replace advice given to you by your health care provider. Make sure you discuss any questions you have with your health care provider. ° °

## 2015-04-15 NOTE — ED Notes (Addendum)
Pt brought in via [facility] from [facility] with chest pain.  Pt states it hurts in the center of my chest.  No sob.  Pt was given mylanta  and zofran at nursing home without relief.   Pain began after eating a sub sandwich.  Pt has picc line right upper arm.  Pt alert.  nsr on monitor.  Skin warm and dry.

## 2015-04-15 NOTE — ED Notes (Signed)
Pt being tranported back to [facility] via [facility], pt arrived with PICC line to left upper arm (double lumen)

## 2015-04-23 ENCOUNTER — Encounter: Payer: Self-pay | Admitting: *Deleted

## 2015-04-23 ENCOUNTER — Other Ambulatory Visit: Payer: Self-pay | Admitting: *Deleted

## 2015-05-01 ENCOUNTER — Encounter: Payer: Self-pay | Admitting: Urology

## 2015-05-01 ENCOUNTER — Ambulatory Visit (INDEPENDENT_AMBULATORY_CARE_PROVIDER_SITE_OTHER): Payer: Medicare Other | Admitting: Urology

## 2015-05-01 VITALS — BP 95/66 | HR 80 | Ht <= 58 in | Wt 109.0 lb

## 2015-05-01 DIAGNOSIS — N200 Calculus of kidney: Secondary | ICD-10-CM | POA: Diagnosis not present

## 2015-05-01 DIAGNOSIS — Z87448 Personal history of other diseases of urinary system: Secondary | ICD-10-CM

## 2015-05-01 DIAGNOSIS — N390 Urinary tract infection, site not specified: Secondary | ICD-10-CM

## 2015-05-01 DIAGNOSIS — Z87898 Personal history of other specified conditions: Secondary | ICD-10-CM

## 2015-05-01 DIAGNOSIS — R339 Retention of urine, unspecified: Secondary | ICD-10-CM

## 2015-05-01 LAB — BLADDER SCAN AMB NON-IMAGING: Scan Result: 32

## 2015-05-01 NOTE — Progress Notes (Signed)
05/01/2015 3:08 PM   Amanda Clay [birth year] [id number]  Referring provider: No referring provider defined for this encounter.  Chief Complaint  Patient presents with  . Urinary Retention    6 month follow up    HPI: Patient is a 34 year old white female with cerebral palsy and a history of urinary retention with urinary tract infections who presents today complaining of an intermittent left-sided flank pain.    Patient was found to have non obstructing left renal calculi on a CT renal stone study completed on 04/02/2015. She had 3 stones in the left kidney all size 2 mm or less.  He has not had fevers, chills, nausea or vomiting. She also denies any gross hematuria or passage of any fragments.  Patient did have a PICC line placed on 04/02/2015 to treat a UTI with a pan resistance pattern. She was having a fever and leukocytosis at that time.   I do not have the lab results for the specific organism or resistant pattern results from the July UTI at this visit.  The patient states that she's had 7 urinary tract infections in the last 8 months. I do not have those lab results available for me at this time.    She states the left flank pain is intermittent in nature.  She states the pain in the left flank occurs 4-5 times a day. It has a duration of 2-3 minutes at a time. She describes the pain as a throbbing sensation.  He has not noted anything that helps the pain or makes the pain worse.  She is wheelchair bound and requires a lift to be transferred. She presents today with only one caregiver from facility.   PMH: Past Medical History  Diagnosis Date  . Cerebral palsy   . Major depressive disorder   . Hypertension   . Overactive bladder   . Insomnia   . Anxiety   . Vitamin deficiency     unspecified  . Constipation   . Muscle weakness (generalized)   . Polyneuropathy   . Migraine   . Urge incontinence   . Stroke   . Hemiplegia and hemiparesis   . GERD  (gastroesophageal reflux disease)   . Polyneuropathy   . Migraine   . Urinary retention   . Yeast vaginitis   . Acute cystitis without hematuria     Surgical History: Past Surgical History  Procedure Laterality Date  . Peripheral vascular catheterization N/A 04/02/2015    Procedure: PICC Line Insertion;  Surgeon: Katha Cabal, MD;  Location: [facility];  Service: Cardiovascular;  Laterality: N/A;  . Coronary angioplasty  2016  . Melanoma excision  2001  . Leg surgeries  2008  . Eye surgeries  1993  . Abducter surgery  2000    Home Medications:    Medication List       This list is accurate as of: 05/01/15 11:59 PM.  Always use your most recent med list.               aspirin 81 MG chewable tablet  Chew 1 tablet by mouth daily.     busPIRone 10 MG tablet  Commonly known as:  BUSPAR  Take 1 tablet by mouth every evening.     busPIRone 15 MG tablet  Commonly known as:  BUSPAR  Take 1 tablet by mouth every morning.     clonazePAM 0.5 MG tablet  Commonly known as:  KLONOPIN  Take 1 tablet by mouth  every evening.     cyclobenzaprine 10 MG tablet  Commonly known as:  FLEXERIL  Take 1 tablet by mouth 3 (three) times daily as needed.     dexlansoprazole 60 MG capsule  Commonly known as:  DEXILANT  Take 1 capsule by mouth daily.     docusate sodium 100 MG capsule  Commonly known as:  COLACE  Take 1 capsule by mouth 2 (two) times daily.     gabapentin 300 MG capsule  Commonly known as:  NEURONTIN  Take 1 capsule by mouth 3 (three) times daily.     medroxyPROGESTERone 150 MG/ML injection  Commonly known as:  DEPO-PROVERA  Inject 1 mL into the muscle every 3 (three) months.     Melatonin 5 MG Caps  Take by mouth.     miconazole 100 MG vaginal suppository  Commonly known as:  [place] vaginally.     modafinil 100 MG tablet  Commonly known as:  PROVIGIL  Take 1 tablet by mouth daily.     MULTI-VITAMINS Tabs  Take 1 tablet by mouth  daily.     nortriptyline 50 MG capsule  Commonly known as:  PAMELOR  Take 1 capsule by mouth every evening.     omeprazole 20 MG capsule  Commonly known as:  PRILOSEC  Take 1 capsule by mouth daily.     ondansetron 4 MG tablet  Commonly known as:  ZOFRAN  Take 1 tablet by mouth every 8 (eight) hours as needed.     oxyCODONE-acetaminophen 5-325 MG per tablet  Commonly known as:  PERCOCET/ROXICET  Take 1 tablet by mouth every 6 (six) hours as needed.     polyethylene glycol packet  Commonly known as:  MIRALAX / GLYCOLAX  Take 1 packet by mouth daily.     propranolol 20 MG tablet  Commonly known as:  INDERAL  Take 1 tablet by mouth 2 (two) times daily.     RA VITAMIN B-12 TR 1000 MCG Tbcr  Generic drug:  Cyanocobalamin  Take 1 tablet by mouth daily.     sertraline 100 MG tablet  Commonly known as:  ZOLOFT  Take 1 tablet by mouth 2 (two) times daily.     solifenacin 10 MG tablet  Commonly known as:  VESICARE  Take 1 tablet by mouth daily.     topiramate 25 MG tablet  Commonly known as:  TOPAMAX  Take 1 tablet by mouth daily.     topiramate 50 MG tablet  Commonly known as:  TOPAMAX  Take 1 tablet by mouth 2 (two) times daily.     traMADol 50 MG tablet  Commonly known as:  ULTRAM  Take 1 tablet by mouth every 6 (six) hours as needed.        Allergies:  Allergies  Allergen Reactions  . Sulfa Antibiotics Hives, Itching and Swelling    Family History: Family History  Problem Relation Age of Onset  . Bladder Cancer Neg Hx   . Kidney disease Neg Hx   . Prostate cancer Neg Hx     Social History:  reports that she has never smoked. She has never used smokeless tobacco. She reports that she does not drink alcohol or use illicit drugs.  ROS General ROS: negative Gastrointestinal ROS: no abdominal pain, change in bowel habits, or black or bloody stools Genito-Urinary ROS: no dysuria, trouble voiding, or hematuria  Physical Exam: BP 95/66 mmHg  Pulse 80  Ht  4' 7"  (1.397 m)  Wt 109  lb (49.442 kg)  BMI 25.33 kg/m2  Constitutional:  Alert and oriented, No acute distress. HEENT: [place] AT, moist mucus membranes.  Trachea midline, no masses. Cardiovascular: No clubbing, cyanosis, or edema. Respiratory: Normal respiratory effort, no increased work of breathing. GI: Abdomen is soft, nontender, nondistended, no abdominal masses GU: No CVA tenderness.  Skin: No rashes, bruises or suspicious lesions. Lymph: No cervical or inguinal adenopathy. Neurologic: Grossly intact, no focal deficits, moving all 4 extremities. Psychiatric: Normal mood and affect.  Laboratory Data: Lab Results  Component Value Date   WBC 7.2 04/15/2015   HGB 10.7* 04/15/2015   HCT 32.5* 04/15/2015   MCV 89.5 04/15/2015   PLT 287 04/15/2015    Lab Results  Component Value Date   CREATININE 0.59 04/15/2015    No results found for: PSA  No results found for: TESTOSTERONE  No results found for: HGBA1C  Urinalysis    Component Value Date/Time   COLORURINE YELLOW* 04/15/2015 0017   APPEARANCEUR TURBID* 04/15/2015 0017   LABSPEC 1.011 04/15/2015 0017   PHURINE 8.0 04/15/2015 0017   GLUCOSEU NEGATIVE 04/15/2015 0017   HGBUR NEGATIVE 04/15/2015 0017   BILIRUBINUR NEGATIVE 04/15/2015 0017   KETONESUR NEGATIVE 04/15/2015 0017   PROTEINUR NEGATIVE 04/15/2015 0017   NITRITE NEGATIVE 04/15/2015 0017   LEUKOCYTESUR 1+* 04/15/2015 0017    Pertinent Imaging: CLINICAL DATA: Hematuria and recurrent urinary tract infections  EXAM: CT ABDOMEN AND PELVIS WITHOUT CONTRAST  TECHNIQUE: Multidetector CT imaging of the abdomen and pelvis was performed following the standard protocol without oral or intravenous contrast material administration.  COMPARISON: None.  FINDINGS: There is left base atelectasis. Lung bases are otherwise clear.  Liver is mildly prominent, measuring 17.3 cm in length. No focal liver lesions are identified on this noncontrast enhanced study.  The gallbladder wall is not appreciably thickened. There is no biliary duct dilatation.  Spleen, pancreas, and adrenals appear normal.  There is a 1 mm calculus in the upper pole left kidney. There is a 2 mm calculus in the mid left kidney. There is a second 2 mm calculus in the mid left kidney. There is no left renal hydronephrosis or mass. There is no left-sided ureteral calculus. On the right, there is no apparent intrarenal calculus. There is no right renal mass or hydronephrosis. There is no appreciable right-sided ureteral calculus.  In the pelvis, urinary bladder is partially decompressed. The urinary bladder wall is felt to be within normal limits given the degree of bladder compression. There is no pelvic mass or pelvic fluid collection. There is no pelvic mass or pelvic fluid collection. Appendix appears normal.  There is no bowel obstruction. No free air or portal venous air.  There is no appreciable ascites, adenopathy, or abscess in the abdomen or pelvis. There is no abdominal aortic aneurysm. Aorta overall is somewhat diminutive. There is degenerative change in the lumbar spine. There is also degenerative change in each hip joint. There are no blastic or lytic bone lesions.  IMPRESSION: Nonobstructing left renal calculi. No hydronephrosis on either side. No ureteral calculi on either side.  No bowel obstruction. No abscess. Appendix appears normal.   Electronically Signed  By: Lowella Grip III M.D.  On: 04/02/2015 16:07   Assessment & Plan:    1. Left nephrolithiasis:   I explained to the patient that her stones were non obstructing at the time of her CAT scan.   It may be a possibility that her left flank pain may be musculoskeletal in nature versus  caused by her left renal stones.  Stones in the left kidney are extremely small, being 2 mm or less, and the probability of her passing them spontaneously was greater than 90%.  She stated she  did not want to wait for the stones to pass spontaneously and she would like a definitive treatment for the stones.  I discussed her options such as MET, ESWL or URS/LL/stent placement.  She did not desire MET or URS/LL/stent placement at this time. She would like to see if she is a candidate for ESWL.  I explained to her that we would need to obtain a KUB to see if the stones are visible on a plain film to see if she wasn't eligible candidate for ESWL. I also explained to her the low probability of a successful treatment with ESWL because of the small size of the stones.  She understands this and will have the KUB.   2. History of urinary retention:   A she states she is voiding well. Her PVR today is minimal at 32 mL. We will continue to monitor at her visits with bladder scans.  - BLADDER SCAN AMB NON-IMAGING  3. Recurrent urinary tract infections:   The most recent results available to me at this time is he urine culture performed on 04/02/2015 which noted multiple organisms.  She was having a high fever at that time in July, site PICC line was placed and she received IV antibiotics. She is having no urinary symptoms at this time.   I do not have the lab results from the other urinary tract infections the patient has suffered over the last 8 months.  Patient is being managed through [facility] and [facility] for these infections.   No Follow-up on file.  Zara Council, [facility] [address], [place], [place] [address] [phone]

## 2015-05-10 DIAGNOSIS — Z87898 Personal history of other specified conditions: Secondary | ICD-10-CM | POA: Insufficient documentation

## 2015-05-10 DIAGNOSIS — N390 Urinary tract infection, site not specified: Secondary | ICD-10-CM | POA: Insufficient documentation

## 2015-05-29 ENCOUNTER — Ambulatory Visit: Payer: Self-pay | Admitting: Obstetrics and Gynecology

## 2015-06-06 ENCOUNTER — Encounter: Payer: Self-pay | Admitting: *Deleted

## 2015-06-06 DIAGNOSIS — I10 Essential (primary) hypertension: Secondary | ICD-10-CM | POA: Diagnosis not present

## 2015-06-06 DIAGNOSIS — Z79899 Other long term (current) drug therapy: Secondary | ICD-10-CM | POA: Insufficient documentation

## 2015-06-06 DIAGNOSIS — N809 Endometriosis, unspecified: Secondary | ICD-10-CM | POA: Diagnosis not present

## 2015-06-06 DIAGNOSIS — Z3202 Encounter for pregnancy test, result negative: Secondary | ICD-10-CM | POA: Diagnosis not present

## 2015-06-06 DIAGNOSIS — Z7982 Long term (current) use of aspirin: Secondary | ICD-10-CM | POA: Insufficient documentation

## 2015-06-06 DIAGNOSIS — R1032 Left lower quadrant pain: Secondary | ICD-10-CM | POA: Diagnosis present

## 2015-06-06 LAB — CBC WITH DIFFERENTIAL/PLATELET
BASOS ABS: 0 10*3/uL (ref 0–0.1)
BASOS PCT: 1 %
Eosinophils Absolute: 0.2 10*3/uL (ref 0–0.7)
Eosinophils Relative: 3 %
HEMATOCRIT: 40.2 % (ref 35.0–47.0)
Hemoglobin: 13.3 g/dL (ref 12.0–16.0)
Lymphocytes Relative: 34 %
Lymphs Abs: 2.6 10*3/uL (ref 1.0–3.6)
MCH: 30 pg (ref 26.0–34.0)
MCHC: 33.1 g/dL (ref 32.0–36.0)
MCV: 90.7 fL (ref 80.0–100.0)
Monocytes Absolute: 0.4 10*3/uL (ref 0.2–0.9)
Monocytes Relative: 5 %
Neutro Abs: 4.4 10*3/uL (ref 1.4–6.5)
Neutrophils Relative %: 57 %
Platelets: 270 10*3/uL (ref 150–440)
RBC: 4.43 MIL/uL (ref 3.80–5.20)
RDW: 13.6 % (ref 11.5–14.5)
WBC: 7.6 10*3/uL (ref 3.6–11.0)

## 2015-06-06 LAB — COMPREHENSIVE METABOLIC PANEL
ALBUMIN: 4.2 g/dL (ref 3.5–5.0)
ALT: 10 U/L — ABNORMAL LOW (ref 14–54)
ANION GAP: 7 (ref 5–15)
AST: 19 U/L (ref 15–41)
Alkaline Phosphatase: 79 U/L (ref 38–126)
BILIRUBIN TOTAL: 0.3 mg/dL (ref 0.3–1.2)
BUN: 10 mg/dL (ref 6–20)
CHLORIDE: 111 mmol/L (ref 101–111)
CO2: 21 mmol/L — ABNORMAL LOW (ref 22–32)
Calcium: 9 mg/dL (ref 8.9–10.3)
Creatinine, Ser: 0.64 mg/dL (ref 0.44–1.00)
GFR calc Af Amer: >60 mL/min (ref >60)
GFR calc non Af Amer: >60 mL/min (ref >60)
GLUCOSE: 96 mg/dL (ref 65–99)
Potassium: 3.6 mmol/L (ref 3.5–5.1)
Sodium: 139 mmol/L (ref 135–145)
TOTAL PROTEIN: 7.9 g/dL (ref 6.5–8.1)

## 2015-06-06 LAB — LIPASE, BLOOD: Lipase: 24 U/L (ref 22–51)

## 2015-06-06 NOTE — ED Notes (Signed)
Per EMS pt from [facility] for rehab, past week having LLQ pain with vaginal bleeding. Hx of endometriosis. Has been on depo 4 years with no previous bleeding.

## 2015-06-07 ENCOUNTER — Emergency Department: Payer: Medicare Other

## 2015-06-07 ENCOUNTER — Emergency Department
Admission: EM | Admit: 2015-06-07 | Discharge: 2015-06-07 | Disposition: A | Discharge status: Home / Self Care | Payer: Medicare Other | Attending: Emergency Medicine | Admitting: Emergency Medicine

## 2015-06-07 DIAGNOSIS — N809 Endometriosis, unspecified: Secondary | ICD-10-CM | POA: Diagnosis not present

## 2015-06-07 DIAGNOSIS — I10 Essential (primary) hypertension: Secondary | ICD-10-CM | POA: Diagnosis not present

## 2015-06-07 DIAGNOSIS — N939 Abnormal uterine and vaginal bleeding, unspecified: Secondary | ICD-10-CM

## 2015-06-07 DIAGNOSIS — R1032 Left lower quadrant pain: Secondary | ICD-10-CM

## 2015-06-07 DIAGNOSIS — Z7982 Long term (current) use of aspirin: Secondary | ICD-10-CM | POA: Diagnosis not present

## 2015-06-07 DIAGNOSIS — Z3202 Encounter for pregnancy test, result negative: Secondary | ICD-10-CM | POA: Diagnosis not present

## 2015-06-07 DIAGNOSIS — Z79899 Other long term (current) drug therapy: Secondary | ICD-10-CM | POA: Diagnosis not present

## 2015-06-07 LAB — URINALYSIS COMPLETE WITH MICROSCOPIC (ARMC ONLY)
Bilirubin Urine: NEGATIVE
GLUCOSE, UA: NEGATIVE mg/dL
Ketones, ur: NEGATIVE mg/dL
LEUKOCYTES UA: NEGATIVE
Nitrite: NEGATIVE
PROTEIN: NEGATIVE mg/dL
SPECIFIC GRAVITY, URINE: 1.017 (ref 1.005–1.030)
pH: 6 (ref 5.0–8.0)

## 2015-06-07 LAB — POCT PREGNANCY, URINE: Preg Test, Ur: NEGATIVE

## 2015-06-07 MED ORDER — KETOROLAC TROMETHAMINE 60 MG/2ML IM SOLN
60.0000 mg | Freq: Once | INTRAMUSCULAR | Status: AC
  Administered 2015-06-07: 60 mg via INTRAMUSCULAR
  Filled 2015-06-07: qty 2

## 2015-06-07 NOTE — ED Provider Notes (Signed)
[facility] Emergency Department Provider Note  ____________________________________________  Time seen: Approximately 0037 AM  I have reviewed the triage vital signs and the nursing notes.   HISTORY  Chief Complaint Vaginal Bleeding and Abdominal Pain    HPI Amanda Clay is a 34 y.o. female who has a history of endometriosis and reports it has been under control for 4 years. She reports that she has been taking Depo shots and has not had any trouble since then.The patient reports that one week ago she started having some pain in her side and then started having some bleeding. The patient reports that during the whole week the pain and the bleeding has not gone away. The patient reports that his left lower quadrant pain near her ovaries. She reports that the bleeding is light and heavy at times and the pain doubles her over. She reports that at her nursing home she has been taking multiple pain pills but the pain continues to return. The patient's last shot was on September 3. She reports that the bleeding and the pain caught her off guard and she wants to know what's going on. The patient is not concerned about pregnancy and denies any pain with urination. She reports her pain as a 10 out of 10 in intensity. She reports that she's been having so much bleeding and pain that they were concerned she may have a cyst on her ovary so she came in for evaluation.    Past Medical History  Diagnosis Date  . Cerebral palsy   . Major depressive disorder   . Hypertension   . Overactive bladder   . Insomnia   . Anxiety   . Vitamin deficiency     unspecified  . Constipation   . Muscle weakness (generalized)   . Polyneuropathy   . Migraine   . Urge incontinence   . Stroke   . Hemiplegia and hemiparesis   . GERD (gastroesophageal reflux disease)   . Polyneuropathy   . Migraine   . Urinary retention   . Yeast vaginitis   . Acute cystitis without hematuria      Patient Active Problem List   Diagnosis Date Noted  . History of urinary retention 05/10/2015  . Recurrent UTI 05/10/2015  . Renal stones 05/01/2015    Past Surgical History  Procedure Laterality Date  . Peripheral vascular catheterization N/A 04/02/2015    Procedure: PICC Line Insertion;  Surgeon: Renford Dills, MD;  Location: [facility];  Service: Cardiovascular;  Laterality: N/A;  . Coronary angioplasty  2016  . Melanoma excision  2001  . Leg surgeries  2008  . Eye surgeries  1993  . Abducter surgery  2000    Current Outpatient Rx  Name  Route  Sig  Dispense  Refill  . aspirin 81 MG chewable tablet   Oral   Chew 1 tablet by mouth daily.         . busPIRone (BUSPAR) 10 MG tablet   Oral   Take 1 tablet by mouth every evening.         . busPIRone (BUSPAR) 15 MG tablet   Oral   Take 1 tablet by mouth every morning.         . clonazePAM (KLONOPIN) 0.5 MG tablet   Oral   Take 1 tablet by mouth every evening.         . Cyanocobalamin (RA VITAMIN B-12 TR) 1000 MCG TBCR   Oral   Take 1  tablet by mouth daily.         . cyclobenzaprine (FLEXERIL) 10 MG tablet   Oral   Take 1 tablet by mouth 3 (three) times daily as needed.         Marland Kitchen dexlansoprazole (DEXILANT) 60 MG capsule   Oral   Take 1 capsule by mouth daily.         Marland Kitchen docusate sodium (COLACE) 100 MG capsule   Oral   Take 1 capsule by mouth 2 (two) times daily.         Marland Kitchen gabapentin (NEURONTIN) 300 MG capsule   Oral   Take 1 capsule by mouth 3 (three) times daily.         . medroxyPROGESTERone (DEPO-PROVERA) 150 MG/ML injection   Intramuscular   Inject 1 mL into the muscle every 3 (three) months.         . Melatonin 5 MG CAPS   Oral   Take by mouth.         . miconazole (MICOTIN) 100 MG vaginal suppository   Vaginal   Place vaginally.         . modafinil (PROVIGIL) 100 MG tablet   Oral   Take 1 tablet by mouth daily.         . Multiple Vitamin  (MULTI-VITAMINS) TABS   Oral   Take 1 tablet by mouth daily.         . nortriptyline (PAMELOR) 50 MG capsule   Oral   Take 1 capsule by mouth every evening.         Marland Kitchen omeprazole (PRILOSEC) 20 MG capsule   Oral   Take 1 capsule by mouth daily.         . ondansetron (ZOFRAN) 4 MG tablet   Oral   Take 1 tablet by mouth every 8 (eight) hours as needed.         Marland Kitchen oxyCODONE-acetaminophen (PERCOCET/ROXICET) 5-325 MG per tablet   Oral   Take 1 tablet by mouth every 6 (six) hours as needed.         . polyethylene glycol (MIRALAX / GLYCOLAX) packet   Oral   Take 1 packet by mouth daily.         . propranolol (INDERAL) 20 MG tablet   Oral   Take 1 tablet by mouth 2 (two) times daily.         . sertraline (ZOLOFT) 100 MG tablet   Oral   Take 1 tablet by mouth 2 (two) times daily.         . solifenacin (VESICARE) 10 MG tablet   Oral   Take 1 tablet by mouth daily.         Marland Kitchen topiramate (TOPAMAX) 25 MG tablet   Oral   Take 1 tablet by mouth daily.         Marland Kitchen topiramate (TOPAMAX) 50 MG tablet   Oral   Take 1 tablet by mouth 2 (two) times daily.         . traMADol (ULTRAM) 50 MG tablet   Oral   Take 1 tablet by mouth every 6 (six) hours as needed.           Allergies Sulfa antibiotics  Family History  Problem Relation Age of Onset  . Bladder Cancer Neg Hx   . Kidney disease Neg Hx   . Prostate cancer Neg Hx     Social History Social History  Substance Use Topics  . Smoking status: Never Smoker   .  Smokeless tobacco: Never Used  . Alcohol Use: No    Review of Systems Constitutional: No fever/chills Eyes: No visual changes. ENT: No sore throat. Cardiovascular: Denies chest pain. Respiratory: Denies shortness of breath. Gastrointestinal: Abdominal pain Genitourinary: Vaginal bleeding Musculoskeletal: Negative for back pain. Skin: Negative for rash. Neurological: Negative for headaches, focal weakness or numbness.  10-point ROS  otherwise negative.  ____________________________________________   PHYSICAL EXAM:  VITAL SIGNS: ED Triage Vitals  Enc Vitals Group     BP 06/06/15 1943 105/56 mmHg     Pulse Rate 06/06/15 1943 104     Resp 06/06/15 1943 16     Temp 06/06/15 1943 97.6 F (36.4 C)     Temp Source 06/06/15 1943 Oral     SpO2 06/06/15 1943 99 %     Weight 06/07/15 0033 108 lb (48.988 kg)     Height 06/07/15 0033  (1.397 m)     Head Cir --      Peak Flow --      Pain Score 06/06/15 1942 10     Pain Loc --      Pain Edu? --      Excl. in GC? --     Constitutional: Alert and oriented. Well appearing and in moderate distress. Eyes: Conjunctivae are normal. PERRL. EOMI. Head: Atraumatic. Nose: No congestion/rhinnorhea. Mouth/Throat: Mucous membranes are moist.  Oropharynx non-erythematous. Cardiovascular: Normal rate, regular rhythm. Grossly normal heart sounds.  Good peripheral circulation. Respiratory: Normal respiratory effort.  No retractions. Lungs CTAB. Gastrointestinal: Soft with left lower quadrant tenderness to palpation. No distention. Genitourinary: Patient refused Musculoskeletal: No lower extremity tenderness nor edema.  Neurologic:  Normal speech and language.  Skin:  Skin is warm, dry and intact.  Psychiatric: Mood and affect are normal.   ____________________________________________   LABS (all labs ordered are listed, but only abnormal results are displayed)  Labs Reviewed  COMPREHENSIVE METABOLIC PANEL - Abnormal; Notable for the following:    CO2 21 (*)    ALT 10 (*)    All other components within normal limits  URINALYSIS COMPLETEWITH MICROSCOPIC ([facility] ONLY) - Abnormal; Notable for the following:    Color, Urine YELLOW (*)    APPearance CLOUDY (*)    Hgb urine dipstick 3+ (*)    Bacteria, UA FEW (*)    Squamous Epithelial / LPF 0-5 (*)    All other components within normal limits  WET PREP, GENITAL  CHLAMYDIA/NGC RT PCR ([facility] ONLY)  LIPASE, BLOOD   CBC WITH DIFFERENTIAL/PLATELET  POCT PREGNANCY, URINE   ____________________________________________  EKG  None ____________________________________________  RADIOLOGY  Ultrasound: Limited study with nonvisualization of the ovaries, normal uterus, no free fluid. ____________________________________________   PROCEDURES  Procedure(s) performed: None  Critical Care performed: No  ____________________________________________   INITIAL IMPRESSION / ASSESSMENT AND PLAN / ED COURSE  Pertinent labs & imaging results that were available during my care of the patient were reviewed by me and considered in my medical decision making (see chart for details).  This is a 34 year old female who comes in today complaining of left lower quadrant pain. I did perform an ultrasound which did not visualize the ovaries but did not see any free fluid. I did offer to perform a pelvic exam but the patient did not want a pelvic exam at this time. I did inform the patient that with endometriosis she could have some pain and she needs to follow-up with GYN. The patient reports that she did not want any further  evaluation and would follow up with OB/GYN. The patient did receive a dose of Toradol for pain and her pain was improved. The patient will be discharged back home to [facility] for further evaluation. ____________________________________________   FINAL CLINICAL IMPRESSION(S) / ED DIAGNOSES  Final diagnoses:  LLQ abdominal pain  Vaginal bleeding  Endometriosis      Rebecka Apley, MD 06/07/15 [phone]

## 2015-06-07 NOTE — ED Notes (Signed)
Pt returning to [facility] via EMS.

## 2015-06-07 NOTE — Discharge Instructions (Signed)
Endometriosis Endometriosis is a condition in which the tissue that lines the uterus (endometrium) grows outside of its normal location. The tissue may grow in many locations close to the uterus, but it commonly grows on the ovaries, fallopian tubes, vagina, or bowel. Because the uterus expels, or sheds, its lining every menstrual cycle, there is bleeding wherever the endometrial tissue is located. This can cause pain because blood is irritating to tissues not normally exposed to it.  CAUSES  The cause of endometriosis is not known.  SIGNS AND SYMPTOMS  Often, there are no symptoms. When symptoms are present, they can vary with the location of the displaced tissue. Various symptoms can occur at different times. Although symptoms occur mainly during a woman's menstrual period, they can also occur midcycle and usually stop with menopause. Some people may go months with no symptoms at all. Symptoms may include:   Back or abdominal pain.   Heavier bleeding during periods.   Pain during intercourse.   Painful bowel movements.   Infertility. DIAGNOSIS  Your health care provider will do a physical exam and ask about your symptoms. Various tests may be done, such as:   Blood tests and urine tests. These are done to help rule out other problems.   Ultrasound. This test is done to look for abnormal tissue.   An X-ray of the lower bowel (barium enema).  Laparoscopy. In this procedure, a thin, lighted tube with a tiny camera on the end (laparoscope) is inserted into your abdomen. This helps your health care provider look for abnormal tissue to confirm the diagnosis. The health care provider may also remove a small piece of tissue (biopsy) from any abnormal tissue found. This tissue sample can then be sent to a lab so it can be looked at under a microscope. TREATMENT  Treatment will vary and may include:   Medicines to relieve pain. Nonsteroidal anti-inflammatory drugs (NSAIDs) are a type of  pain medicine that can help to relieve the pain caused by endometriosis.  Hormonal therapy. When using hormonal therapy, periods are eliminated. This eliminates the monthly exposure to blood by the displaced endometrial tissue.   Surgery. Surgery may sometimes be done to remove the abnormal endometrial tissue. In severe cases, surgery may be done to remove the fallopian tubes, uterus, and ovaries (hysterectomy). HOME CARE INSTRUCTIONS   Take all medicines as directed by your health care provider. Do not take aspirin because it may increase bleeding when you are not on hormonal therapy.   Avoid activities that produce pain, including sexual activity. SEEK MEDICAL CARE IF:  You have pelvic pain before, after, or during your periods.  You have pelvic pain between periods that gets worse during your period.  You have pelvic pain during or after sex.  You have pelvic pain with bowel movements or urination, especially during your period.  You have problems getting pregnant.  You have a fever. SEEK IMMEDIATE MEDICAL CARE IF:   Your pain is severe and is not responding to pain medicine.   You have severe nausea and vomiting, or you cannot keep foods down.   You have pain that is limited to the right lower part of your abdomen.   You have swelling or increasing pain in your abdomen.   You see blood in your stool.  MAKE SURE YOU:   Understand these instructions.  Will watch your condition.  Will get help right away if you are not doing well or get worse. Document Released: 08/20/2000 Document   Revised: 01/07/2014 Document Reviewed: 04/20/2013 [facility] Patient Information 2015 [place], [place]. This information is not intended to replace advice given to you by your health care provider. Make sure you discuss any questions you have with your health care provider.  Abdominal Pain, Women Abdominal (stomach, pelvic, or belly) pain can be caused by many things. It is important to  tell your doctor:  The location of the pain.  Does it come and go or is it present all the time?  Are there things that start the pain (eating certain foods, exercise)?  Are there other symptoms associated with the pain (fever, nausea, vomiting, diarrhea)? All of this is helpful to know when trying to find the cause of the pain. CAUSES   Stomach: virus or bacteria infection, or ulcer.  Intestine: appendicitis (inflamed appendix), regional ileitis (Crohn's disease), ulcerative colitis (inflamed colon), irritable bowel syndrome, diverticulitis (inflamed diverticulum of the colon), or cancer of the stomach or intestine.  Gallbladder disease or stones in the gallbladder.  Kidney disease, kidney stones, or infection.  Pancreas infection or cancer.  Fibromyalgia (pain disorder).  Diseases of the female organs:  Uterus: fibroid (non-cancerous) tumors or infection.  Fallopian tubes: infection or tubal pregnancy.  Ovary: cysts or tumors.  Pelvic adhesions (scar tissue).  Endometriosis (uterus lining tissue growing in the pelvis and on the pelvic organs).  Pelvic congestion syndrome (female organs filling up with blood just before the menstrual period).  Pain with the menstrual period.  Pain with ovulation (producing an egg).  Pain with an IUD (intrauterine device, birth control) in the uterus.  Cancer of the female organs.  Functional pain (pain not caused by a disease, may improve without treatment).  Psychological pain.  Depression. DIAGNOSIS  Your doctor will decide the seriousness of your pain by doing an examination.  Blood tests.  X-rays.  Ultrasound.  CT scan (computed tomography, special type of X-ray).  MRI (magnetic resonance imaging).  Cultures, for infection.  Barium enema (dye inserted in the large intestine, to better view it with X-rays).  Colonoscopy (looking in intestine with a lighted tube).  Laparoscopy (minor surgery, looking in abdomen  with a lighted tube).  Major abdominal exploratory surgery (looking in abdomen with a large incision). TREATMENT  The treatment will depend on the cause of the pain.   Many cases can be observed and treated at home.  Over-the-counter medicines recommended by your caregiver.  Prescription medicine.  Antibiotics, for infection.  Birth control pills, for painful periods or for ovulation pain.  Hormone treatment, for endometriosis.  Nerve blocking injections.  Physical therapy.  Antidepressants.  Counseling with a psychologist or psychiatrist.  Minor or major surgery. HOME CARE INSTRUCTIONS   Do not take laxatives, unless directed by your caregiver.  Take over-the-counter pain medicine only if ordered by your caregiver. Do not take aspirin because it can cause an upset stomach or bleeding.  Try a clear liquid diet (broth or water) as ordered by your caregiver. Slowly move to a bland diet, as tolerated, if the pain is related to the stomach or intestine.  Have a thermometer and take your temperature several times a day, and record it.  Bed rest and sleep, if it helps the pain.  Avoid sexual intercourse, if it causes pain.  Avoid stressful situations.  Keep your follow-up appointments and tests, as your caregiver orders.  If the pain does not go away with medicine or surgery, you may try:  Acupuncture.  Relaxation exercises (yoga, meditation).  Group therapy.  Counseling. SEEK MEDICAL CARE IF:   You notice certain foods cause stomach pain.  Your home care treatment is not helping your pain.  You need stronger pain medicine.  You want your IUD removed.  You feel faint or lightheaded.  You develop nausea and vomiting.  You develop a rash.  You are having side effects or an allergy to your medicine. SEEK IMMEDIATE MEDICAL CARE IF:   Your pain does not go away or gets worse.  You have a fever.  Your pain is felt only in portions of the abdomen. The  right side could possibly be appendicitis. The left lower portion of the abdomen could be colitis or diverticulitis.  You are passing blood in your stools (bright red or black tarry stools, with or without vomiting).  You have blood in your urine.  You develop chills, with or without a fever.  You pass out. MAKE SURE YOU:   Understand these instructions.  Will watch your condition.  Will get help right away if you are not doing well or get worse. Document Released: 06/20/2007 Document Revised: 01/07/2014 Document Reviewed: 07/10/2009 [facility] Patient Information 2015 [place], [place]. This information is not intended to replace advice given to you by your health care provider. Make sure you discuss any questions you have with your health care provider.  Abnormal Uterine Bleeding Abnormal uterine bleeding can affect women at various stages in life, including teenagers, women in their reproductive years, pregnant women, and women who have reached menopause. Several kinds of uterine bleeding are considered abnormal, including:  Bleeding or spotting between periods.   Bleeding after sexual intercourse.   Bleeding that is heavier or more than normal.   Periods that last longer than usual.  Bleeding after menopause.  Many cases of abnormal uterine bleeding are minor and simple to treat, while others are more serious. Any type of abnormal bleeding should be evaluated by your health care provider. Treatment will depend on the cause of the bleeding. HOME CARE INSTRUCTIONS Monitor your condition for any changes. The following actions may help to alleviate any discomfort you are experiencing:  Avoid the use of tampons and douches as directed by your health care provider.  Change your pads frequently. You should get regular pelvic exams and Pap tests. Keep all follow-up appointments for diagnostic tests as directed by your health care provider.  SEEK MEDICAL CARE IF:   Your bleeding  lasts more than 1 week.   You feel dizzy at times.  SEEK IMMEDIATE MEDICAL CARE IF:   You pass out.   You are changing pads every 15 to 30 minutes.   You have abdominal pain.  You have a fever.   You become sweaty or weak.   You are passing large blood clots from the vagina.   You start to feel nauseous and vomit. MAKE SURE YOU:   Understand these instructions.  Will watch your condition.  Will get help right away if you are not doing well or get worse. Document Released: 08/23/2005 Document Revised: 08/28/2013 Document Reviewed: 03/22/2013 [facility] Patient Information 2015 [place], [place]. This information is not intended to replace advice given to you by your health care provider. Make sure you discuss any questions you have with your health care provider.

## 2015-07-26 ENCOUNTER — Other Ambulatory Visit
Admission: RE | Admit: 2015-07-26 | Discharge: 2015-07-26 | Disposition: A | Discharge status: Home / Self Care | Payer: Medicare Other | Source: Ambulatory Visit | Attending: Family Medicine | Admitting: Family Medicine

## 2015-07-26 DIAGNOSIS — R8290 Unspecified abnormal findings in urine: Secondary | ICD-10-CM | POA: Insufficient documentation

## 2015-07-26 DIAGNOSIS — F320 Major depressive disorder, single episode, mild: Secondary | ICD-10-CM | POA: Diagnosis present

## 2015-07-26 DIAGNOSIS — G809 Cerebral palsy, unspecified: Secondary | ICD-10-CM | POA: Diagnosis present

## 2015-07-26 DIAGNOSIS — N3281 Overactive bladder: Secondary | ICD-10-CM | POA: Insufficient documentation

## 2015-07-26 DIAGNOSIS — I10 Essential (primary) hypertension: Secondary | ICD-10-CM | POA: Insufficient documentation

## 2015-07-26 LAB — URINALYSIS COMPLETE WITH MICROSCOPIC (ARMC ONLY)
Bilirubin Urine: NEGATIVE
Glucose, UA: NEGATIVE mg/dL
Hgb urine dipstick: NEGATIVE
Ketones, ur: NEGATIVE mg/dL
Leukocytes, UA: NEGATIVE
Nitrite: NEGATIVE
Protein, ur: NEGATIVE mg/dL
Specific Gravity, Urine: 1.018 (ref 1.005–1.030)
pH: 6 (ref 5.0–8.0)

## 2015-07-28 LAB — URINE CULTURE

## 2015-08-07 ENCOUNTER — Ambulatory Visit: Payer: Medicare Other | Attending: Obstetrics and Gynecology | Admitting: Physical Therapy

## 2015-08-07 DIAGNOSIS — M791 Myalgia: Secondary | ICD-10-CM | POA: Diagnosis present

## 2015-08-07 DIAGNOSIS — IMO0001 Reserved for inherently not codable concepts without codable children: Secondary | ICD-10-CM

## 2015-08-07 DIAGNOSIS — M609 Myositis, unspecified: Secondary | ICD-10-CM | POA: Diagnosis present

## 2015-08-07 DIAGNOSIS — M439 Deforming dorsopathy, unspecified: Secondary | ICD-10-CM | POA: Diagnosis present

## 2015-08-07 NOTE — Patient Instructions (Signed)
5 reps  morning, afternoon, night  --work on these stretches   1. Side stretch to the Left ( make a rainbow over head)  Breathe while you stretch and look at your palm     2. "Chest lift"  Push arms into WC to lean hest and head back while breathing out

## 2015-08-08 NOTE — Therapy (Addendum)
[facility] MAIN [facility] SERVICES [address] [place], [place], [address] Phone: [phone]   Fax:  [phone]  Physical Therapy Evaluation  Patient Details  Name: Amanda Clay MRN: [id number] Date of Birth: August 05, 1981 Referring Provider: Tildon Husky  Encounter Date: 08/07/2015      PT End of Session - 08/07/15 2037    Visit Number 1   Number of Visits 8   Date for PT Re-Evaluation 08/08/15   Authorization Type g-code   PT Start Time 1211   PT Stop Time 1325   PT Time Calculation (min) 74 min   Activity Tolerance Patient tolerated treatment well;No increased pain   Behavior During Therapy [facility] for tasks assessed/performed      Past Medical History  Diagnosis Date  . Cerebral palsy   . Major depressive disorder   . Hypertension   . Overactive bladder   . Insomnia   . Anxiety   . Vitamin deficiency     unspecified  . Constipation   . Muscle weakness (generalized)   . Polyneuropathy   . Migraine   . Urge incontinence   . Stroke   . Hemiplegia and hemiparesis   . GERD (gastroesophageal reflux disease)   . Polyneuropathy   . Migraine   . Urinary retention   . Yeast vaginitis   . Acute cystitis without hematuria     Past Surgical History  Procedure Laterality Date  . Peripheral vascular catheterization N/A 04/02/2015    Procedure: PICC Line Insertion;  Surgeon: Renford Dills, MD;  Location: [facility] INVASIVE CV LAB;  Service: Cardiovascular;  Laterality: N/A;  . Coronary angioplasty  2016  . Melanoma excision  2001  . Leg surgeries  2008  . Eye surgeries  1993  . Abducter surgery  2000    There were no vitals filed for this visit.  Visit Diagnosis:  Postural deformity -   Myalgia and myositis -       Subjective Assessment - 08/07/15 2036    Subjective Pt reports she has endometriosis that is getting into her legs and experiences "aweful pain like someone has got their hands in her and trying  pulling my insides". Pt  describes pelvic pain that radiates down into BLE into toes. Pt rpeorts if she stays in one position, in a ball, the pain is not as bad. The more she moves, the more she hurts. Pt reports pain with moving in her bed for ADLs 3-4x day.  Pt also reported she has an infection in her uterus  and is scheduled for surgery on 08/22/15. Pt is unable to tell the difference between the infection pain and the endometriosis related pain. Pt states she feels swollen in her private parts and has had sensation as if "her insides would fall out onto the floor" and especially when she practices standing during PT session.  Pt would like to learn how to tolerate pain with rolling in bed and touch to abdomen and vaginal area when receiving hand bath provided by her aids at her nursing home.       Patient is accompained by: --  aid from SNF            [facility] PT Assessment - 08/07/15 1935    Assessment   Medical Diagnosis Pelvic pain    Referring Provider [place]   Onset Date/Surgical Date 08/22/15   Precautions   Precautions None   Restrictions   Weight Bearing Restrictions No   Home Environment  Living Environment Skilled nursing facility   Prior Function   Level of Independence Needs assistance with ADLs   Observation/Other Assessments   Observations severe forward head and thoracic kyphosis   in electric WC   Other Surveys  --  PSFS tolerate touch to stomach/perineum w/ hand bath 0/10   Coordination   Gross Motor Movements are Fluid and Coordinated --  chest breathing   Posture/Postural Control   Postural Limitations Rounded Shoulders;Forward head;Increased thoracic kyphosis;Posterior pelvic tilt;Flexed trunk  thoracolumbar scoliosis   AROM   Overall AROM Comments rounded shoulder limit shoulder abd to ~120 deg    Palpation   Palpation comment increased pain and tensions w/ palpation along spine, and abdomen                     [facility] Adult PT Treatment/Exercise - 08/07/15 1935     Self-Care   Self-Care --  POC, goals, anatomy and physiology   Exercises   Other Exercises  L side flexion AROM 5 reps, thoracic ext in electric WC 5 reps on exhale                 PT Education - 08/07/15 2036    Education provided Yes   Education Details HEP, POC, goals, anatomy and physiology   Person(s) Educated Patient;Caregiver(s)   Methods Explanation;Demonstration;Tactile cues;Verbal cues;Handout   Comprehension Verbal cues required;Returned demonstration;Verbalized understanding;Tactile cues required             PT Long Term Goals - 08/07/15 2039    PT LONG TERM GOAL #1   Title Pt will report being able to tolerate palpation to abdomen/perineum area based on PSFS from 0/10 to 1/10 in order to be able to tolerate hand baths.    Time 3   Period Weeks   Status New   PT LONG TERM GOAL #2   Title Pt will demo IND with HEP.   Time 3   Period Weeks   Status New   PT LONG TERM GOAL #3   Title Pt will report decreased pain from 6/10 to < 4/10 pain in order to show better self-management of pain with HEP.    Time 3   Status New               Plan - 08/07/15 2031    Clinical Impression Statement Pt is a 34 yo female whose S & Sx consist of pelvic pain, Hx of endometriosis, cerebal palsy, CVA, thoracolumbar scoliosis, severe forward head/ thoracic kyphosis, increased spinal mm tension, poor postural control, and pain with palpation along spine and abdomen. These deficits limit her ability to roll in bed and receive hand baths without pain. Pt's POC is abbreviated due to pt's upcoming surgercal procedure.     Pt will benefit from skilled therapeutic intervention in order to improve on the following deficits Abnormal gait;Decreased endurance;Hypomobility;Impaired tone;Pain;Decreased strength;Difficulty walking;Increased muscle spasms;Decreased mobility;Decreased balance;Decreased coordination;Decreased safety awareness;Postural dysfunction;Improper body  mechanics;Impaired perceived functional ability;Decreased range of motion;Decreased activity tolerance   Rehab Potential Fair   Clinical Impairments Affecting Rehab Potential cerebral palsy, CVA, endometriosis, scoliosis, WC dependent   PT Frequency 2x / week   PT Duration 3 weeks   PT Treatment/Interventions ADLs/Self Care Home Management;Moist Heat;Electrical Stimulation;Cryotherapy;Biofeedback;Neuromuscular re-education;Contrast Bath;Stair training;Functional mobility training;Therapeutic activities;Therapeutic exercise;Balance training;Manual lymph drainage;Manual techniques;Patient/family education;Dry needling;Energy conservation;Taping;Passive range of motion   Consulted and Agree with Plan of Care Patient         Problem List Patient Active Problem List  Diagnosis Date Noted  . History of urinary retention 05/10/2015  . Recurrent UTI 05/10/2015  . Renal stones 05/01/2015    Mariane Masters ,PT, DPT, E-RYT  08/13/2015, 8:52 PM  [facility] MAIN [facility] SERVICES [address] [place], [place], [address] Phone: [phone]   Fax:  [phone]  Name: MYLISA BRUNSON MRN: [id number] Date of Birth: 08/04/1981

## 2015-08-11 ENCOUNTER — Ambulatory Visit: Payer: Medicare Other | Admitting: Physical Therapy

## 2015-08-12 ENCOUNTER — Ambulatory Visit: Payer: Medicare Other | Admitting: Physical Therapy

## 2015-08-12 DIAGNOSIS — M439 Deforming dorsopathy, unspecified: Secondary | ICD-10-CM | POA: Diagnosis not present

## 2015-08-12 DIAGNOSIS — IMO0001 Reserved for inherently not codable concepts without codable children: Secondary | ICD-10-CM

## 2015-08-13 NOTE — Therapy (Signed)
[facility] MAIN [facility] [address] [place], [place], [address] Phone: [phone]   Fax:  [phone]  Physical Therapy Treatment  Patient Details  Name: Amanda KaMiranda D Dilday MRN: [id number] Date of Birth: 05-18-1981 Referring Provider: Tildon HuskyHalfon  Encounter Date: 08/12/2015      PT End of Session - 08/13/15 2117    Visit Number 2   Number of Visits 8   Date for PT Re-Evaluation 08/08/15   Authorization Type g-code   PT Start Time 1400   PT Stop Time 1500   PT Time Calculation (min) 60 min   Activity Tolerance Patient tolerated treatment well;No increased pain   Behavior [facility] for tasks assessed/performed      Past Medical History  Diagnosis Date  . Cerebral palsy   . Major depressive disorder   . Hypertension   . Overactive bladder   . Insomnia   . Anxiety   . Vitamin deficiency     unspecified  . Constipation   . Muscle weakness (generalized)   . Polyneuropathy   . Migraine   . Urge incontinence   . Stroke   . Hemiplegia and hemiparesis   . GERD (gastroesophageal reflux disease)   . Polyneuropathy   . Migraine   . Urinary retention   . Yeast vaginitis   . Acute cystitis without hematuria     Past Surgical History  Procedure Laterality Date  . Peripheral vascular catheterization N/A 04/02/2015    Procedure: PICC Line Insertion;  Surgeon: Renford DillsGregory G Schnier, MD;  Location: [facility] INVASIVE CV LAB;  Service: Cardiovascular;  Laterality: N/A;  . Coronary angioplasty  2016  . Melanoma excision  2001  . Leg surgeries  2008  . Eye surgeries  1993  . Abducter surgery  2000    There were no vitals filed for this visit.  Visit Diagnosis:  Postural deformity  Myalgia and myositis      Subjective Assessment - 08/13/15 2047    Subjective Pt reports she has been performing her HEP but she still feels tailbone pain at a 10/10 pain. Pt reported she has feels nausated after eating her meals.    Patient is  accompained by: --  aid from SNF   Pertinent History Hx of Cerebal Palsy, scoliosis, CVA Aug 2015 L-sided deficits. Pt [facility] Father died 3 weeks prior CVA.    Patient Stated Goals "get some tips to make it easier to know what to do when the pain gets that bad"  Tolerable pain 5/10    Currently in Pain? Yes   Pain Score 6    Pain Location Coccyx            [facility] PT Assessment - 08/13/15 2057    Observation/Other Assessments   Observations pre-Tx: severe forward head posture, agitated affect, Post-Tx: pt appeared more relaxed and slightly improved head posture    Posture/Postural Control   Posture Comments WC reclined, pillow behind head, pt able to demo head over shoulder alignment and increased relaxed demeanor    Palpation   Palpation comment pt not able to tolerate light palpation to paraspinal mm, withheld manual tx a                     [facility] Adult PT Treatment/Exercise - 08/13/15 2101    Posture/Postural Control   Posture Comments WC reclined, pillow behind head, pt able to  demo head over shoulder alignment and increased relaxed demeanor    Self-Care   Self-Care --   Other Self-Care Comments  educated about the importance of relaxation/strengthening for pain, posture for digestion and pain   Neuro Re-ed    Neuro Re-ed Details  pain science, relaxation practice with tilting the WC w/ pillow to rest head and to maintain neutral spine, alternate reclined position 30 min with regular WC position for 30 min to offload tailbone and position shifting to minize skin sores.    Exercises   Other Exercises  bilateral UE overhead with breathing, vocalizing to stimulate postural strength (Massery;s research), thoracic extensions with bilateral elbow extension on lap.                 PT Education - 08/13/15 2116    Education provided Yes   Education Details HEP   Person(s) Educated Patient   Methods  Explanation;Demonstration;Tactile cues;Verbal cues;Handout   Comprehension Verbalized understanding;Returned demonstration             PT Long Term Goals - 08/13/15 2039    PT LONG TERM GOAL #1   Title Pt will report being able to tolerate palpation to abdomen/perineum area based on PSFS from 0/10 to 1/10 in order to be able to tolerate hand baths.    Time 2   Period Weeks   Status New   PT LONG TERM GOAL #2   Title Pt will demo IND with HEP.   Time 2   Period Weeks   Status New   PT LONG TERM GOAL #3   Title Pt will report decreased pain from 6/10 to < 4/10 pain in order to show better self-management of pain with HEP.    Time 2   Status New               Plan - 08/13/15 2117    Clinical Impression Statement Pt did not tolerate light soft-tissue mobilization and responded better to gravity eliminated positioning (reclined in tilt in space WC, pillow behind her head) which faciliated increased relaxation. Educated pt about the role of posture to decrease load through her spine/tailbone and to improve digestion and abdominal/pelvic pain. Pt reported decreased pain from 10/10 to 6/10 after Tx.    Pt will benefit from skilled therapeutic intervention in order to improve on the following deficits Decreased endurance;Hypomobility;Impaired tone;Pain;Decreased strength;Difficulty walking;Increased muscle spasms;Decreased mobility;Decreased balance;Decreased coordination;Decreased safety awareness;Postural dysfunction;Improper body mechanics;Impaired perceived functional ability;Decreased range of motion;Decreased activity tolerance   Rehab Potential Fair   PT Frequency 2x / week   PT Duration 3 weeks   PT Treatment/Interventions ADLs/Self Care Home Management;Moist Heat;Electrical Stimulation;Cryotherapy;Biofeedback;Neuromuscular re-education;Contrast Bath;Stair training;Functional mobility training;Therapeutic activities;Therapeutic exercise;Balance training;Manual lymph  drainage;Manual techniques;Patient/family education;Dry needling;Energy conservation;Taping;Passive range of motion   Consulted and Agree with Plan of Care Patient        Problem List Patient Active Problem List   Diagnosis Date Noted  . History of urinary retention 05/10/2015  . Recurrent UTI 05/10/2015  . Renal stones 05/01/2015    Mariane Masters ,PT, DPT, E-RYT  08/13/2015, 9:37 PM  [facility] MAIN [facility] SERVICES [address] [place], [place], [address] Phone: [phone]   Fax:  [phone]  Name: Amanda Clay MRN: [id number] Date of Birth: 10-Jan-1981

## 2015-08-13 NOTE — Addendum Note (Signed)
Addended by: Mariane MastersYEUNG, SHIN-YIING on: 08/13/2015 08:45 PM   Modules accepted: Orders

## 2015-08-13 NOTE — Addendum Note (Signed)
Addended by: Mariane MastersYEUNG, SHIN-YIING on: 08/13/2015 08:54 PM   Modules accepted: Orders

## 2015-08-13 NOTE — Addendum Note (Signed)
Addended by: Mariane MastersYEUNG, SHIN-YIING on: 08/13/2015 08:46 PM   Modules accepted: Orders

## 2015-08-14 ENCOUNTER — Encounter
Admission: RE | Admit: 2015-08-14 | Discharge: 2015-08-14 | Disposition: A | Discharge status: Home / Self Care | Payer: Medicare Other | Source: Ambulatory Visit | Attending: Obstetrics and Gynecology | Admitting: Obstetrics and Gynecology

## 2015-08-14 ENCOUNTER — Ambulatory Visit: Payer: Medicare Other | Admitting: Physical Therapy

## 2015-08-14 DIAGNOSIS — R102 Pelvic and perineal pain: Secondary | ICD-10-CM | POA: Diagnosis not present

## 2015-08-14 DIAGNOSIS — Z01812 Encounter for preprocedural laboratory examination: Secondary | ICD-10-CM | POA: Diagnosis not present

## 2015-08-14 HISTORY — DX: Panic disorder (episodic paroxysmal anxiety): F41.0

## 2015-08-14 HISTORY — DX: Unspecified convulsions: R56.9

## 2015-08-14 HISTORY — DX: Polyneuropathy, unspecified: G62.9

## 2015-08-14 HISTORY — DX: Scoliosis, unspecified: M41.9

## 2015-08-14 HISTORY — DX: Reserved for inherently not codable concepts without codable children: IMO0001

## 2015-08-14 LAB — CBC
HEMATOCRIT: 38.6 % (ref 35.0–47.0)
HEMOGLOBIN: 12.7 g/dL (ref 12.0–16.0)
MCH: 30 pg (ref 26.0–34.0)
MCHC: 32.9 g/dL (ref 32.0–36.0)
MCV: 91 fL (ref 80.0–100.0)
Platelets: 242 10*3/uL (ref 150–440)
RBC: 4.25 MIL/uL (ref 3.80–5.20)
RDW: 13 % (ref 11.5–14.5)
WBC: 6.8 10*3/uL (ref 3.6–11.0)

## 2015-08-14 NOTE — Pre-Procedure Instructions (Signed)
Spoke with Dr Pernell DupreAdams re patient history of cerebral palsy and stroke with left side hemiplegia

## 2015-08-14 NOTE — Patient Instructions (Addendum)
  Your procedure is scheduled on: 08/22/15 Report to Day Surgery.[facility] SECOND FLOOR To find out your arrival time please call [phone] between 1PM - 3PM on 08/21/15 Remember: Instructions that are not followed completely may result in serious medical risk, up to and including death, or upon the discretion of your surgeon and anesthesiologist your surgery may need to be rescheduled.    ___X_ 1. Do not eat food or drink liquids after midnight. No gum chewing or hard candies.     _X___ 2. No Alcohol for 24 hours before or after surgery.   ____ 3. Bring all medications with you on the day of surgery if instructed.    _X___ 4. Notify your doctor if there is any change in your medical condition     (cold, fever, infections).     Do not wear jewelry, make-up, hairpins, clips or nail polish.  Do not wear lotions, powders, or perfumes. You may wear deodorant.  Do not shave 48 hours prior to surgery. Men may shave face and neck.  Do not bring valuables to the hospital.    [facility] is not responsible for any belongings or valuables.               Contacts, dentures or bridgework may not be worn into surgery.  Leave your suitcase in the car. After surgery it may be brought to your room.  For patients admitted to the hospital, discharge time is determined by your                treatment team.   Patients discharged the day of surgery will not be allowed to drive home.   Please read over the following fact sheets that you were given:   Surgical Site Infection Prevention   ____ Take these medicines the morning of surgery with A SIP OF WATER:    1. BUSPIRONE  2. CLONAZEPAM  3. FLEXERIL  4. GABAPENTIN  5. SERTRALINE              6. TOPAMAX               7. DEXILANT  DR HALFON    [phone]   ____ Fleet Enema (as directed)   ____ Use CHG Soap as directed  ____ Use inhalers on the day of surgery  ____ Stop metformin 2 days prior to surgery    ____ Take 1/2 of usual  insulin dose the night before surgery and none on the morning of surgery.   ____ Stop Coumadin/Plavix/aspirin on   ____ Stop Anti-inflammatories on   ____ Stop supplements until after surgery.    ____ Bring C-Pap to the hospital.

## 2015-08-14 NOTE — H&P (Addendum)
GYN Pre-op H&P   Subjective:    LMP: daily spotting since Mid Sept  Pt [facility]) Pt is a poor historian  Amanda McardleMiranda Clay is a 34 y.o. female with h/o cerebral palsy and history of CVA with residual left-sided weakness in 2014 who presented initially for h/o endometriosis and acute pelvic pain that she believed was a flare of her endometriosis.  She had been on depo-provera for the past 9 years. She had no bleeding during those 9 years and no pelvic pain. In September, she describes black clots from her vagina with associated severe cramping, the worst pain of her life, radiating to her vagina. Her vagina also feels very tender. No dysuria, no dyschezia, no n/v. No abnl discharge/itching. She wears depends and needs her diaper changed frequently for frequent urination. She has known urge incontinence and had previously been treated with antispasmodic (according to records), but per patient they recently discontinued this medicine. [facility], patient was diagnosed with UTI on 04/01/2015.   The patient's main complaint is the pain. She wants a hysterectomy as she was told that she would one day need it because of her endometriosis. Uncertain what pain medications she is taking  Since our last visit, the bleeding has subsided, but the pain has not improved. She describes two separate pains - one in her vagina at the entrance and one all in her lower abdomen. The lower abdominal pain is described as burning and pulling. She has associated nausea and bloating. She is not constipated. She has burning with urination now. She cannot bear the pain.   She is now sexually active. Np dysparunia.   The plan was to start a trial of Depo-Lupron, but the patient's insurance will not pay for it. She was prescribed treatment for PID (azithromycin 1g and doxycyline 100mg  ID x 7 days) in addition to her treatment for BV infection  (metronidazole for 7 days) as she was still c/o pelvic pain since her last visit.   She desires a mirena IUD for contraception and dysmenorrhea control after counseling.   Menstrual History: OB History    Gravida Para Term Preterm AB TAB SAB Ectopic Multiple Living   1    1  1         claims a 22 week delivery at home with fetal demise, required a D&C after (no records of this)  Menarche age: [age] No LMP recorded. Patient has had an injection.  At age 34, she had a right ovarian cystectomy for a 4.5cm cyst - confirmed endometriosis Started depo-provera Denies h/o STDs   Past Medical History:  has a past medical history of Cerebral palsy; Constipation; Depression; Gastro-esophageal reflux; Hemiplegia affecting right dominant side; Hypertension; Insomnia; and Stroke. Problem List: has Other headache syndrome; Confusion; Sleep disorder; Short-term memory loss; Fatigue; and Pelvic pain in female on her problem list. Past Surgical History:  has no past surgical history on file. Family History: Family history is unknown by patient. Social History:  reports that she has never smoked. She does not have any smokeless tobacco history on file. She reports that she does not drink alcohol or use illicit drugs. Current Medications: has a current medication list which includes the following prescription(s): aspirin, buspirone, buspirone, clonazepam, cyanocobalamin, cyclobenzaprine, dexlansoprazole, docusate, doxycycline, gabapentin, medroxyprogesterone, melatonin, miconazole, modafinil, modafinil, multivitamin, norethindrone, nortriptyline, nystatin, omeprazole, ondansetron, oxycodone-acetaminophen, polyethylene glycol, sertraline, solifenacin, topiramate, topiramate, and tramadol. Prior to encounter Medications:  Current Outpatient Prescriptions on File Prior to Visit  Medication Sig Dispense Refill  . aspirin 81 MG chewable tablet Take 81 mg by mouth once daily.    . busPIRone  (BUSPAR) 10 MG tablet Take 10 mg by mouth nightly.    . busPIRone (BUSPAR) 15 MG tablet Take 15 mg by mouth every morning.    . clonazePAM (KLONOPIN) 0.5 MG tablet Take 0.5 mg by mouth nightly as needed for Anxiety.    . cyanocobalamin (VITAMIN B12) 1000 MCG tablet Take 1 tablet (1,000 mcg total) by mouth once daily. 30 tablet 5  . cyclobenzaprine (FLEXERIL) 10 MG tablet Take 10 mg by mouth 3 (three) times daily as needed for Muscle spasms.    Marland Kitchen dexlansoprazole (DEXILANT) 60 mg DR capsule Take 60 mg by mouth once daily.    Marland Kitchen docusate (COLACE) 100 MG capsule Take 100 mg by mouth 2 (two) times daily.    Marland Kitchen gabapentin (NEURONTIN) 300 MG capsule Take 300 mg by mouth 3 (three) times daily.    . melatonin 5 mg Cap Take by mouth.    . miconazole 100 mg vaginal suppository Place 100 mg vaginally nightly.    . modafinil (PROVIGIL) 100 MG tablet Take 1 tablet (100 mg total) by mouth once daily. 30 tablet 5  . modafinil (PROVIGIL) 100 MG tablet Take 100 mg by mouth once daily.    . multivitamin tablet Take 1 tablet by mouth once daily.    . nortriptyline (PAMELOR) 50 MG capsule Take 1 capsule (50 mg total) by mouth nightly. 30 capsule 3  . omeprazole (PRILOSEC) 20 MG DR capsule Take 20 mg by mouth once daily.    . ondansetron (ZOFRAN) 4 MG tablet Take 4 mg by mouth every 8 (eight) hours as needed for Nausea.    Marland Kitchen oxyCODONE-acetaminophen (PERCOCET) 5-325 mg tablet Take 1 tablet by mouth every 6 (six) hours as needed for Pain.    . polyethylene glycol (MIRALAX) packet Take 17 g by mouth once daily. Mix in 4-8ounces of fluid prior to taking.    . sertraline (ZOLOFT) 100 MG tablet Take 100 mg by mouth 2 (two) times daily.    . solifenacin (VESICARE) 10 MG tablet Take 10 mg by mouth once daily.    Marland Kitchen topiramate (TOPAMAX) 25 MG tablet Take 25 mg by mouth once daily.    Marland Kitchen topiramate (TOPAMAX) 50 MG tablet Take 50 mg by mouth 2 (two) times daily.    . traMADol (ULTRAM) 50 mg  tablet Take 50 mg by mouth every 6 (six) hours as needed for Pain.     No current facility-administered medications on file prior to visit.    Allergies: is allergic to sulfa (sulfonamide antibiotics).   Review of Systems -  Other than above, 14 system ROS negative    Objective:     Vitals   There were no vitals filed for this visit.      General appearance: alert, appears stated age and cooperative Eyes: right eye unable to focus  PULM: CTAB CV: RRR Abdomen: abnormal findings: distended and guarding, no rebound; +TTP on single leg raise b/l Pelvic: Vulva: no lesions, introitus normal color; significant spasms on palpation of introitus Vagina: no discharge, no lesions, normal rugae; no levator ani spasm on single digit exam Bladder: no bladder TTP Urethra: normal meatus Cervix: no CMT, normal cervix without lesions or friability Uterus: TTP on bimanual exam when palpating the fundus, exam limited by abdominal guarding Adnexa: TTP on bimanual exam,  not single digit, exam limited by abdominal guarding Extremities: no edema, redness or tenderness in the calves or thighs, contracted, unable to separate for exam, had to lift legs up in the air to perform vaginal exam and SE Skin: Skin color, texture, turgor normal. No rashes or lesions Lymph nodes: Inguinal adenopathy: none  TVUS 11/18: uterus wnl, ovaries wnl, no masses   Assessment:     34 y.o. female with h/o cerebral palsy and history of CVA with residual left-sided weakness here for chronic pelvic pain. DDX includes flare of endometriosis, pelvic infection, myofascial pain syndrome. Also with component of vulvodynia/vaginismus.  An extensive conversation was had with the patient regarding the multifactoral etiology of her pain. She is s/p treatment for PID, she has no evidence of endometriomas or fibroids that could be exacerbating her pelvic pain. She has also stopped bleeding, which may suggest that the  endometriosis flare is over. The myofascial pain component is not uncommon in women with limited mobility and is a frequent cause of chronic pelvic pain. she has never been evaluated for interstitial cystitis (per notes and patient recall). The patient is not a good surgical candidate for a total hysterectomy given her co-morbidities and limited mobility, and given the myofascial pain component, this may not cure her pain. We agreed to a trial of pelvic floor therapy and mirena IUD. We can also prescribe lidocaine patches to the abdomen to help with the pain.   Plan:    1. Pelvic pain - R/B/A of mirena IUD for endometriosis and dysmenorrhea discussed with patient as an alternative to depo-lupron. Patient would like a trial of this.  - given patient's inability to tolerated the exam and limited mobility, plan for OR for mirena IUD placement - also plan for cystoscopy to evaluate for interstitial cystitis - pelvic floor therapy for vulvodynia/vaginismus and myofascial pain syndrome   - PLAN: to OR for Cystoscopy, D&C, Mirena IUD placement - consents signed, all questions answered   A total of 30 minutes was spent with the patient with greater than 25 minutes spent counseling  Burnett Corrente, MD   ADDENDUM:  Results for orders placed or performed during the hospital encounter of 08/14/15  Urine culture     Status: None   Collection Time: 08/14/15 10:45 PM  Result Value Ref Range Status   Specimen Description URINE, RANDOM  Final   Special Requests NONE  Final   Culture 20,000 COLONIES/mL ENTEROBACTER AEROGENES  Final   Report Status 08/17/2015 FINAL  Final   Organism ID, Bacteria ENTEROBACTER AEROGENES  Final      Susceptibility   Enterobacter aerogenes - MIC*    CEFTAZIDIME <=1 SENSITIVE Sensitive     CEFAZOLIN >=64 RESISTANT Resistant     CEFTRIAXONE <=1 SENSITIVE Sensitive     CIPROFLOXACIN <=0.25 SENSITIVE Sensitive     GENTAMICIN <=1 SENSITIVE Sensitive     IMIPENEM 1  SENSITIVE Sensitive     TRIMETH/SULFA <=20 SENSITIVE Sensitive     NITROFURANTOIN Value in next row Intermediate      INTERMEDIATE64    PIP/TAZO Value in next row Sensitive      SENSITIVE<=4    LEVOFLOXACIN Value in next row Sensitive      SENSITIVE<=0.12    * 20,000 COLONIES/mL ENTEROBACTER AEROGENES    Will treat with 3 days of cipro. Will not perform cystoscopy due to active cystitis.   Launa Flight, CMA called [employer] to inform them of the infection.   Ala Dach, MD

## 2015-08-15 LAB — URINALYSIS COMPLETE WITH MICROSCOPIC (ARMC ONLY)
Bilirubin Urine: NEGATIVE
GLUCOSE, UA: NEGATIVE mg/dL
HGB URINE DIPSTICK: NEGATIVE
Leukocytes, UA: NEGATIVE
Nitrite: NEGATIVE
PH: 6 (ref 5.0–8.0)
Protein, ur: 30 mg/dL — AB
Specific Gravity, Urine: 1.019 (ref 1.005–1.030)

## 2015-08-17 LAB — URINE CULTURE

## 2015-08-18 ENCOUNTER — Ambulatory Visit: Payer: Medicare Other | Admitting: Physical Therapy

## 2015-08-20 ENCOUNTER — Ambulatory Visit: Payer: Medicare Other | Admitting: Physical Therapy

## 2015-08-22 ENCOUNTER — Encounter
Admission: RE | Disposition: A | Discharge status: Skilled Nursing Facility | Payer: Self-pay | Source: Ambulatory Visit | Attending: Obstetrics and Gynecology

## 2015-08-22 ENCOUNTER — Encounter: Payer: Self-pay | Admitting: Obstetrics and Gynecology

## 2015-08-22 ENCOUNTER — Ambulatory Visit: Payer: Medicare Other | Admitting: Anesthesiology

## 2015-08-22 ENCOUNTER — Ambulatory Visit
Admission: RE | Admit: 2015-08-22 | Discharge: 2015-08-22 | Disposition: A | Discharge status: Skilled Nursing Facility | Payer: Medicare Other | Source: Ambulatory Visit | Attending: Obstetrics and Gynecology | Admitting: Obstetrics and Gynecology

## 2015-08-22 DIAGNOSIS — N809 Endometriosis, unspecified: Secondary | ICD-10-CM | POA: Insufficient documentation

## 2015-08-22 DIAGNOSIS — N94819 Vulvodynia, unspecified: Secondary | ICD-10-CM | POA: Diagnosis not present

## 2015-08-22 DIAGNOSIS — I69354 Hemiplegia and hemiparesis following cerebral infarction affecting left non-dominant side: Secondary | ICD-10-CM | POA: Insufficient documentation

## 2015-08-22 DIAGNOSIS — N739 Female pelvic inflammatory disease, unspecified: Secondary | ICD-10-CM | POA: Diagnosis not present

## 2015-08-22 DIAGNOSIS — R102 Pelvic and perineal pain: Secondary | ICD-10-CM | POA: Insufficient documentation

## 2015-08-22 DIAGNOSIS — G8929 Other chronic pain: Secondary | ICD-10-CM | POA: Insufficient documentation

## 2015-08-22 DIAGNOSIS — R51 Headache: Secondary | ICD-10-CM | POA: Insufficient documentation

## 2015-08-22 DIAGNOSIS — Z7982 Long term (current) use of aspirin: Secondary | ICD-10-CM | POA: Diagnosis not present

## 2015-08-22 DIAGNOSIS — I10 Essential (primary) hypertension: Secondary | ICD-10-CM | POA: Insufficient documentation

## 2015-08-22 DIAGNOSIS — K219 Gastro-esophageal reflux disease without esophagitis: Secondary | ICD-10-CM | POA: Insufficient documentation

## 2015-08-22 DIAGNOSIS — G809 Cerebral palsy, unspecified: Secondary | ICD-10-CM | POA: Insufficient documentation

## 2015-08-22 DIAGNOSIS — F329 Major depressive disorder, single episode, unspecified: Secondary | ICD-10-CM | POA: Diagnosis not present

## 2015-08-22 DIAGNOSIS — N946 Dysmenorrhea, unspecified: Secondary | ICD-10-CM | POA: Diagnosis not present

## 2015-08-22 DIAGNOSIS — N3941 Urge incontinence: Secondary | ICD-10-CM | POA: Insufficient documentation

## 2015-08-22 DIAGNOSIS — Z79899 Other long term (current) drug therapy: Secondary | ICD-10-CM | POA: Diagnosis not present

## 2015-08-22 HISTORY — PX: INTRAUTERINE DEVICE (IUD) INSERTION: SHX5877

## 2015-08-22 HISTORY — PX: HYSTEROSCOPY WITH D & C: SHX1775

## 2015-08-22 LAB — POCT PREGNANCY, URINE: PREG TEST UR: NEGATIVE

## 2015-08-22 SURGERY — INSERTION, INTRAUTERINE DEVICE
Anesthesia: Monitor Anesthesia Care

## 2015-08-22 MED ORDER — PROPOFOL 500 MG/50ML IV EMUL
INTRAVENOUS | Status: DC | PRN
  Administered 2015-08-22: 50 ug/kg/min via INTRAVENOUS

## 2015-08-22 MED ORDER — ACETAMINOPHEN 650 MG RE SUPP: 650.0000 mg | Freq: Four times a day (QID) | RECTAL | Status: DC | PRN

## 2015-08-22 MED ORDER — LACTATED RINGERS IV SOLN
INTRAVENOUS | Status: DC
  Administered 2015-08-22 (×2): via INTRAVENOUS

## 2015-08-22 MED ORDER — DIPHENHYDRAMINE HCL 50 MG/ML IJ SOLN: 12.5000 mg | Freq: Four times a day (QID) | INTRAMUSCULAR | Status: DC | PRN

## 2015-08-22 MED ORDER — FENTANYL CITRATE (PF) 100 MCG/2ML IJ SOLN
INTRAMUSCULAR | Status: DC | PRN
  Administered 2015-08-22 (×2): 50 ug via INTRAVENOUS

## 2015-08-22 MED ORDER — OXYCODONE HCL 5 MG PO TABS: 5.0000 mg | ORAL_TABLET | ORAL | Status: DC | PRN

## 2015-08-22 MED ORDER — CIPROFLOXACIN IN D5W 400 MG/200ML IV SOLN
400.0000 mg | Freq: Once | INTRAVENOUS | Status: AC
  Administered 2015-08-22: 400 mg via INTRAVENOUS

## 2015-08-22 MED ORDER — ONDANSETRON HCL 4 MG/2ML IJ SOLN
4.0000 mg | Freq: Once | INTRAMUSCULAR | Status: AC | PRN
  Administered 2015-08-22: 4 mg via INTRAVENOUS

## 2015-08-22 MED ORDER — MIDAZOLAM HCL 2 MG/2ML IJ SOLN
INTRAMUSCULAR | Status: DC | PRN
  Administered 2015-08-22 (×4): 1 mg via INTRAVENOUS

## 2015-08-22 MED ORDER — LACTATED RINGERS IV SOLN: INTRAVENOUS | Status: DC

## 2015-08-22 MED ORDER — KETOROLAC TROMETHAMINE 15 MG/ML IJ SOLN: 15.0000 mg | Freq: Four times a day (QID) | INTRAMUSCULAR | Status: DC

## 2015-08-22 MED ORDER — PROPOFOL 10 MG/ML IV BOLUS
INTRAVENOUS | Status: DC | PRN
  Administered 2015-08-22 (×4): 20 mg via INTRAVENOUS

## 2015-08-22 MED ORDER — ACETAMINOPHEN 10 MG/ML IV SOLN
INTRAVENOUS | Status: DC | PRN
  Administered 2015-08-22: 1000 mg via INTRAVENOUS

## 2015-08-22 MED ORDER — DIPHENHYDRAMINE HCL 12.5 MG/5ML PO ELIX: 12.5000 mg | ORAL_SOLUTION | Freq: Four times a day (QID) | ORAL | Status: DC | PRN

## 2015-08-22 MED ORDER — ACETAMINOPHEN 325 MG PO TABS: 650.0000 mg | ORAL_TABLET | Freq: Four times a day (QID) | ORAL | Status: DC | PRN

## 2015-08-22 MED ORDER — FENTANYL CITRATE (PF) 100 MCG/2ML IJ SOLN
25.0000 ug | INTRAMUSCULAR | Status: DC | PRN
  Administered 2015-08-22 (×4): 25 ug via INTRAVENOUS

## 2015-08-22 MED ORDER — FENTANYL CITRATE (PF) 100 MCG/2ML IJ SOLN
INTRAMUSCULAR | Status: AC
  Filled 2015-08-22: qty 2

## 2015-08-22 MED ORDER — KETOROLAC TROMETHAMINE 15 MG/ML IJ SOLN: 15.0000 mg | Freq: Four times a day (QID) | INTRAMUSCULAR | Status: DC | PRN

## 2015-08-22 MED ORDER — ACETAMINOPHEN 10 MG/ML IV SOLN
INTRAVENOUS | Status: AC
  Filled 2015-08-22: qty 100

## 2015-08-22 MED ORDER — SILVER NITRATE-POT NITRATE 75-25 % EX MISC
CUTANEOUS | Status: AC
  Filled 2015-08-22: qty 1

## 2015-08-22 MED ORDER — SILVER NITRATE-POT NITRATE 75-25 % EX MISC
CUTANEOUS | Status: DC | PRN
  Administered 2015-08-22: 1

## 2015-08-22 MED ORDER — KETOROLAC TROMETHAMINE 15 MG/ML IJ SOLN
INTRAMUSCULAR | Status: DC | PRN
  Administered 2015-08-22: 15 mg via INTRAVENOUS

## 2015-08-22 MED ORDER — SILVER NITRATE-POT NITRATE 75-25 % EX MISC
CUTANEOUS | Status: AC
  Filled 2015-08-22: qty 4

## 2015-08-22 MED ORDER — PROMETHAZINE HCL 25 MG/ML IJ SOLN: 12.5000 mg | Freq: Four times a day (QID) | INTRAMUSCULAR | Status: DC | PRN

## 2015-08-22 SURGICAL SUPPLY — 16 items
BAG URO DRAIN 2000ML W/SPOUT (MISCELLANEOUS) ×3 IMPLANT
CATH ROBINSON RED A/P 16FR (CATHETERS) ×3 IMPLANT
GLOVE BIO SURGEON STRL SZ 6.5 (GLOVE) ×12 IMPLANT
GLOVE BIO SURGEON STRL SZ7 (GLOVE) ×12 IMPLANT
GLOVE INDICATOR 6.5 STRL GRN (GLOVE) ×12 IMPLANT
GLOVE INDICATOR 7.0 STRL GRN (GLOVE) ×12 IMPLANT
GOWN STRL REUS W/ TWL LRG LVL3 (GOWN DISPOSABLE) ×4 IMPLANT
GOWN STRL REUS W/TWL LRG LVL3 (GOWN DISPOSABLE) ×2
IV LACTATED RINGERS 1000ML (IV SOLUTION) ×3 IMPLANT
KIT RM TURNOVER CYSTO AR (KITS) ×3 IMPLANT
NS IRRIG 500ML POUR BTL (IV SOLUTION) ×3 IMPLANT
PACK DNC HYST (MISCELLANEOUS) ×3 IMPLANT
PAD PREP 24X41 OB/GYN DISP (PERSONAL CARE ITEMS) ×3 IMPLANT
SET CYSTO W/LG BORE CLAMP LF (SET/KITS/TRAYS/PACK) ×6 IMPLANT
SURGILUBE 2OZ TUBE FLIPTOP (MISCELLANEOUS) ×3 IMPLANT
TOWEL OR 17X26 4PK STRL BLUE (TOWEL DISPOSABLE) ×3 IMPLANT

## 2015-08-22 NOTE — Anesthesia Postprocedure Evaluation (Signed)
Anesthesia Post Note  Patient: Amanda Clay  Procedure(s) Performed: Procedure(s) (LRB): INTRAUTERINE DEVICE (IUD) INSERTION (N/A) DILATATION AND CURETTAGE /HYSTEROSCOPY  Patient location during evaluation: PACU Anesthesia Type: General Level of consciousness: awake and alert Pain management: pain level controlled Vital Signs Assessment: post-procedure vital signs reviewed and stable Respiratory status: spontaneous breathing, nonlabored ventilation, respiratory function stable and patient connected to nasal cannula oxygen Cardiovascular status: blood pressure returned to baseline and stable Anesthetic complications: no    Last Vitals:  Filed Vitals:   08/22/15 1222 08/22/15 1237  BP: 120/84 91/61  Pulse: 104 78  Temp: 37.4 C   Resp: 24 13    Last Pain:  Filed Vitals:   08/22/15 1246  PainSc: Asleep                 Yevette EdwardsJames G Adams

## 2015-08-22 NOTE — Anesthesia Preprocedure Evaluation (Signed)
Anesthesia Evaluation  Patient identified by MRN, date of birth, ID band Patient awake    Reviewed: Allergy & Precautions, H&P , NPO status , Patient's Chart, lab work & pertinent test results, reviewed documented beta blocker date and time   History of Anesthesia Complications (+) PONV  Airway Mallampati: II  TM Distance: >3 FB Neck ROM: full    Dental no notable dental hx. (+) Teeth Intact   Pulmonary neg pulmonary ROS, shortness of breath,    Pulmonary exam normal breath sounds clear to auscultation       Cardiovascular Exercise Tolerance: Good hypertension, negative cardio ROS   Rhythm:regular Rate:Normal     Neuro/Psych  Headaches, Seizures -,  PSYCHIATRIC DISORDERS  Neuromuscular disease CVA negative neurological ROS  negative psych ROS   GI/Hepatic negative GI ROS, Neg liver ROS, GERD  ,  Endo/Other  negative endocrine ROSdiabetes  Renal/GU Renal disease     Musculoskeletal   Abdominal   Peds  Hematology negative hematology ROS (+)   Anesthesia Other Findings   Reproductive/Obstetrics negative OB ROS                             Anesthesia Physical Anesthesia Plan  ASA: IV  Anesthesia Plan: MAC   Post-op Pain Management:    Induction:   Airway Management Planned:   Additional Equipment:   Intra-op Plan:   Post-operative Plan:   Informed Consent: I have reviewed the patients History and Physical, chart, labs and discussed the procedure including the risks, benefits and alternatives for the proposed anesthesia with the patient or authorized representative who has indicated his/her understanding and acceptance.     Plan Discussed with: CRNA  Anesthesia Plan Comments:         Anesthesia Quick Evaluation

## 2015-08-22 NOTE — OR Nursing (Signed)
IUD info:  [id number], exp 12/2018 [place], 52 mg

## 2015-08-22 NOTE — Interval H&P Note (Signed)
History and Physical Interval Note:  08/22/2015 10:27 AM  Amanda Clay  has presented today for surgery, with the diagnosis of endometriosis  The various methods of treatment have been discussed with the patient and family. After consideration of risks, benefits and other options for treatment, the patient has consented [facility] as a surgical intervention .  The patient's history has been reviewed, patient examined, no change in status, stable for surgery.  I have reviewed the patient's chart and labs.  Questions were answered to the patient's satisfaction.  We will no longer be doing the cystoscopy due to an active UTI.    Leonette MostJohanna K Halfon

## 2015-08-22 NOTE — Discharge Instructions (Signed)
Instructions:  1. For Liletta IUD - bleeding and cramping is normal after insertion, should be minimal, no more than 1 heavy pad/hour for 2 hours. You may have spotting for the next few months. This is normal. You can take ibuprofen or your usual medications for pain control as needed. 2. NOTHING in vagina for 2 weeks - no baths, no tampons, no douching, no sex 3. Continue 2 days of oral Cipro (  BID) at facility to treat UTI 4. Please obtain all medical records from your previous OBGYN regarding your endometriosis evaluation and previous OB care  See Below for more specific instructions for what we did   Intrauterine Device Insertion, Care After Refer to this sheet in the next few weeks. These instructions provide you with information on caring for yourself after your procedure. Your health care provider may also give you more specific instructions. Your treatment has been planned according to current medical practices, but problems sometimes occur. Call your health care provider if you have any problems or questions after your procedure. WHAT TO EXPECT AFTER THE PROCEDURE Insertion of the IUD may cause some discomfort, such as cramping. The cramping should improve after the IUD is in place. You may have bleeding after the procedure. This is normal. It varies from light spotting for a few days to menstrual-like bleeding. When the IUD is in place, a string will extend past the cervix into the vagina for 1-2 inches. The strings should not bother you or your partner. If they do, talk to your health care provider.  HOME CARE INSTRUCTIONS   Check your intrauterine device (IUD) to make sure it is in place before you resume sexual activity. You should be able to feel the strings. If you cannot feel the strings, something may be wrong. The IUD may have fallen out of the uterus, or the uterus may have been punctured (perforated) during placement. Also, if the strings are getting longer, it may mean that  the IUD is being forced out of the uterus. You no longer have full protection from pregnancy if any of these problems occur.  You may resume sexual intercourse if you are not having problems with the IUD. The copper IUD is considered immediately effective, and the hormone IUD works right away if inserted within 7 days of your period starting. You will need to use a backup method of birth control for 7 days if the IUD in inserted at any other time in your cycle.  Continue to check that the IUD is still in place by feeling for the strings after every menstrual period.  You may need to take pain medicine such as acetaminophen or ibuprofen. Only take medicines as directed by your health care provider. SEEK MEDICAL CARE IF:   You have bleeding that is heavier or lasts longer than a normal menstrual cycle.  You have a fever.  You have increasing cramps or abdominal pain not relieved with medicine.  You have abdominal pain that does not seem to be related to the same area of earlier cramping and pain.  You are lightheaded, unusually weak, or faint.  You have abnormal vaginal discharge or smells.  You have pain during sexual intercourse.  You cannot feel the IUD strings, or the IUD string has gotten longer.  You feel the IUD at the opening of the cervix in the vagina.  You think you are pregnant, or you miss your menstrual period.  The IUD string is hurting your sex partner. MAKE SURE YOU:  Understand these instructions.  Will watch your condition.  Will get help right away if you are not doing well or get worse.   This information is not intended to replace advice given to you by your health care provider. Make sure you discuss any questions you have with your health care provider.   Document Released: 04/21/2011 Document Revised: 06/13/2013 Document Reviewed: 02/11/2013 Elsevier Interactive Patient Education 2016 [employer]  Dilation and Curettage or Vacuum Curettage, Care  After Refer to this sheet in the next few weeks. These instructions provide you with information on caring for yourself after your procedure. Your health care provider may also give you more specific instructions. Your treatment has been planned according to current medical practices, but problems sometimes occur. Call your health care provider if you have any problems or questions after your procedure. WHAT TO EXPECT AFTER THE PROCEDURE After your procedure, it is typical to have light cramping and bleeding. This may last for 2 days to 2 weeks after the procedure. HOME CARE INSTRUCTIONS   Do not drive for 24 hours.  Wait 1 week before returning to strenuous activities.  Take your temperature 2 times a day for 4 days and write it down. Provide these temperatures to your health care provider if you develop a fever.  Avoid long periods of standing.  Avoid heavy lifting, pushing, or pulling. Do not lift anything heavier than 10 pounds (4.5 kg).  Limit stair climbing to once or twice a day.  Take rest periods often.  You may resume your usual diet.  Drink enough fluids to keep your urine clear or pale yellow.  Your usual bowel function should return. If you have constipation, you may:  Take a mild laxative with permission from your health care provider.  Add fruit and bran to your diet.  Drink more fluids.  Take showers instead of baths until your health care provider gives you permission to take baths. - NO BATHS FOR 2 WEEKS  Do not go swimming or use a hot tub until your health care provider approves.  Try to have someone with you or available to you the first 24-48 hours, especially if you were given a general anesthetic.  Do not douche, use tampons, or have sex (intercourse) for 2 weeks after the procedure.  Only take over-the-counter or prescription medicines as directed by your health care provider. Do not take aspirin. It can cause bleeding.  Follow up with your health  care provider as directed. SEEK MEDICAL CARE IF:   You have increasing cramps or pain that is not relieved with medicine.  You have abdominal pain that does not seem to be related to the same area of earlier cramping and pain.  You have bad smelling vaginal discharge.  You have a rash.  You are having problems with any medicine. SEEK IMMEDIATE MEDICAL CARE IF:   You have bleeding that is heavier than a normal menstrual period.  You have a fever.  You have chest pain.  You have shortness of breath.  You feel dizzy or feel like fainting.  You pass out.  You have pain in your shoulder strap area.  You have heavy vaginal bleeding with or without blood clots. MAKE SURE YOU:   Understand these instructions.  Will watch your condition.  Will get help right away if you are not doing well or get worse.   This information is not intended to replace advice given to you by your health care provider. Make sure  you discuss any questions you have with your health care provider.   Document Released: 08/20/2000 Document Revised: 08/28/2013 Document Reviewed: 03/22/2013 Elsevier Interactive Patient Education [employer]

## 2015-08-22 NOTE — Progress Notes (Signed)
D[facility]c [facility] tele [facility], pt accompanied by caretaker

## 2015-08-22 NOTE — Op Note (Signed)
Operative Report Hysteroscopy with Dilation and Curettage   Indications: Birth control, Dysmenorrhea    Pre-operative Diagnosis: Dysmenorrhea   Post-operative Diagnosis: same.  Procedure: 1. Exam under anesthesia 2. D&C 3. Hysteroscopy 4. Liletta IUD insertion  Surgeon: Burnett CorrenteJohanna Elyanah Farino, MD  Assistant(s):  None  Anesthesia: General mask inhalational anesthesia   Estimated Blood Loss:  less than 100 mL         Intraoperative medications: none         Total IV Fluids: 600ml  Urine Output: not recorded         Specimens: Endocervical curettings, endometrial curettings         Complications:  None; patient tolerated the procedure well.         Disposition: PACU - hemodynamically stable.         Condition: stable  Findings: Uterus measuring 7 cm by sound; normal cervix, vagina, perineum.   Indication for procedure/Consents: 34 y.o. No obstetric history on file.  here for scheduled surgery for the aforementioned diagnoses.  Risks of surgery were discussed with the patient including but not limited to: bleeding which may require transfusion; infection which may require antibiotics; injury to uterus or surrounding organs; intrauterine scarring which may impair future fertility; need for additional procedures including laparotomy or laparoscopy; and other postoperative/anesthesia complications. Written informed consent was obtained.    Procedure Details:   The patient was taken to the operating room where MAC anesthesia was administered and was found to be adequate. After a formal and adequate timeout was performed, she was placed in the dorsal lithotomy position with candy canes stirrups due to contractures of lower extremities limiting movement. She was examined with the above findings. She was then prepped and draped in the sterile manner.A weighed speculum was then placed in the patient's vagina and a single tooth tenaculum was applied to the anterior lip of the cervix.  Her  cervix was serially dilated to 17 [place] using Hanks dilators, but extensive resistance was encountered. Due to the resistance, a hysteroscope was introduced which showed a pinpoint internal os. Hydrodilation was achieved. The hysteroscope was removed and a 7 [place] dilator was used to further dilate the internal os. The uterus sounded to 7cm.  A sharp curettage was then performed until there was a gritty texture in all four quadrants.The hysteroscope was inserted which confirmed a tortuous internal os and the cavity was visualized. The hysteroscope was removed and the Liletta was inserted. Strings cut to 3cm. (LOT [id number]; EXP 12/2018). The tenaculum was removed from the anterior lip of the cervix and the vaginal speculum was removed after applying silver nitrate for good hemostasis. The patient tolerated the procedure well and was taken to the recovery area awake and in stable condition.  The patient will be discharged to home as per PACU criteria. Routine postoperative instructions given. She was prescribed Ibuprofen and Colace. She will follow up in the clinic in four weeks for postoperative evaluation.  Ala DachJohanna K Shanee Batch, MD

## 2015-08-22 NOTE — OR Nursing (Signed)
Patient to PACU very upset and crying, c/o burning and pain in vaginal area.  Staff stated that patient was very anxious and afraid pre-op.  Patient had to be given 2 mg of Versed to calm down in pacu.  Also given Fentanyl for pain.

## 2015-08-22 NOTE — Transfer of Care (Signed)
Immediate Anesthesia Transfer of Care Note  Patient: Amanda Clay  Procedure(s) Performed: Procedure(s): INTRAUTERINE DEVICE (IUD) INSERTION (N/A) DILATATION AND CURETTAGE /HYSTEROSCOPY  Patient Location: PACU  Anesthesia Type:MAC  Level of Consciousness: awake, alert  and oriented  Airway & Oxygen Therapy: Patient Spontanous Breathing  Post-op Assessment: Report given to RN and Post -op Vital signs reviewed and stable  Post vital signs: Reviewed and stable  Last Vitals:  Filed Vitals:   08/22/15 0918 08/22/15 1222  BP: 102/58 120/84  Pulse: 66 104  Temp: 36.3 C 37.4 C  Resp: 12 24    Complications: No apparent anesthesia complications

## 2015-08-25 LAB — SURGICAL PATHOLOGY

## 2015-08-26 LAB — POCT PREGNANCY, URINE: Preg Test, Ur: NEGATIVE

## 2015-09-08 ENCOUNTER — Other Ambulatory Visit
Admission: RE | Admit: 2015-09-08 | Discharge: 2015-09-08 | Disposition: A | Discharge status: Home / Self Care | Payer: Medicare Other | Source: Skilled Nursing Facility | Attending: Family Medicine | Admitting: Family Medicine

## 2015-09-08 DIAGNOSIS — R300 Dysuria: Secondary | ICD-10-CM | POA: Diagnosis present

## 2015-09-08 LAB — URINALYSIS COMPLETE WITH MICROSCOPIC (ARMC ONLY)
BACTERIA UA: NONE SEEN
Bilirubin Urine: NEGATIVE
Glucose, UA: NEGATIVE mg/dL
HGB URINE DIPSTICK: NEGATIVE
Ketones, ur: NEGATIVE mg/dL
Leukocytes, UA: NEGATIVE
Nitrite: NEGATIVE
PH: 8 (ref 5.0–8.0)
PROTEIN: NEGATIVE mg/dL
Specific Gravity, Urine: 1.009 (ref 1.005–1.030)

## 2015-09-09 LAB — URINE CULTURE: CULTURE: NO GROWTH

## 2015-09-20 ENCOUNTER — Encounter: Payer: Self-pay | Admitting: Emergency Medicine

## 2015-09-20 ENCOUNTER — Emergency Department
Admission: EM | Admit: 2015-09-20 | Discharge: 2015-09-21 | Disposition: A | Discharge status: Home / Self Care | Payer: Medicare Other | Attending: Emergency Medicine | Admitting: Emergency Medicine

## 2015-09-20 DIAGNOSIS — Z7982 Long term (current) use of aspirin: Secondary | ICD-10-CM | POA: Diagnosis not present

## 2015-09-20 DIAGNOSIS — F419 Anxiety disorder, unspecified: Secondary | ICD-10-CM | POA: Insufficient documentation

## 2015-09-20 DIAGNOSIS — I10 Essential (primary) hypertension: Secondary | ICD-10-CM | POA: Insufficient documentation

## 2015-09-20 DIAGNOSIS — Z79899 Other long term (current) drug therapy: Secondary | ICD-10-CM | POA: Insufficient documentation

## 2015-09-20 DIAGNOSIS — R102 Pelvic and perineal pain: Secondary | ICD-10-CM | POA: Diagnosis not present

## 2015-09-20 DIAGNOSIS — Z975 Presence of (intrauterine) contraceptive device: Secondary | ICD-10-CM

## 2015-09-20 HISTORY — DX: Endometriosis, unspecified: N80.9

## 2015-09-20 NOTE — ED Notes (Signed)
Per [facility] pt has had extensive uterine hx. Pt reports that she "just wants it (her IUD) out". Pt is a/o and with NAD at this time.

## 2015-09-21 ENCOUNTER — Emergency Department: Payer: Medicare Other

## 2015-09-21 DIAGNOSIS — R102 Pelvic and perineal pain: Secondary | ICD-10-CM | POA: Diagnosis not present

## 2015-09-21 LAB — URINALYSIS COMPLETE WITH MICROSCOPIC (ARMC ONLY)
Bilirubin Urine: NEGATIVE
Glucose, UA: NEGATIVE mg/dL
KETONES UR: NEGATIVE mg/dL
LEUKOCYTES UA: NEGATIVE
NITRITE: NEGATIVE
PH: 6 (ref 5.0–8.0)
PROTEIN: NEGATIVE mg/dL
SPECIFIC GRAVITY, URINE: 1.017 (ref 1.005–1.030)

## 2015-09-21 NOTE — ED Provider Notes (Signed)
[facility] Emergency Department Provider Note  ____________________________________________  Time seen: On arrival  I have reviewed the triage vital signs and the nursing notes.   HISTORY  Chief Complaint Pelvic Pain    HPI Amanda Clay is a 35 y.o. female with a significant past medical history as listed belowwho presents with complaints of pelvic pain. She has a long history of endometriosis and pelvic discomfort. In December she had an iud placed and she reports since then she feels that she is being "poked" by it whenever she moves. She denies fevers or chills. She denies vaginal discharge. No nausea or vomiting. She is here because she wants her IUD removed. She denies dysuria     Past Medical History  Diagnosis Date  . Cerebral palsy (HCC)   . Major depressive disorder (HCC)   . Hypertension   . Overactive bladder   . Insomnia   . Anxiety   . Vitamin deficiency     unspecified  . Constipation   . Muscle weakness (generalized)   . Polyneuropathy (HCC)   . Migraine   . Urge incontinence   . Hemiplegia and hemiparesis (HCC)   . GERD (gastroesophageal reflux disease)   . Polyneuropathy (HCC)   . Migraine   . Urinary retention   . Yeast vaginitis   . Acute cystitis without hematuria   . Panic attacks   . Stroke (HCC)     12/2012  . Shortness of breath dyspnea     when on back,scoliosis  . Scoliosis   . Seizures (HCC)     x 4 unknown etiology  last 3 years ago  . Insomnia   . Neuropathy (HCC)     poly  . Endometriosis     Patient Active Problem List   Diagnosis Date Noted  . History of urinary retention 05/10/2015  . Recurrent UTI 05/10/2015  . Renal stones 05/01/2015    Past Surgical History  Procedure Laterality Date  . Peripheral vascular catheterization N/A 04/02/2015    Procedure: PICC Line Insertion;  Surgeon: Renford DillsGregory G Schnier, MD;  Location: [facility] INVASIVE CV LAB;  Service: Cardiovascular;  Laterality: N/A;  .  Coronary angioplasty  2016  . Melanoma excision  2001  . Leg surgeries  2008  . Eye surgeries  1993  . Abducter surgery  2000  . Intrauterine device (iud) insertion N/A 08/22/2015    Procedure: INTRAUTERINE DEVICE (IUD) INSERTION;  Surgeon: Ala DachJohanna K Halfon, MD;  Location: [facility] ORS;  Service: Gynecology;  Laterality: N/A;  . Hysteroscopy w/d&c  08/22/2015    Procedure: DILATATION AND CURETTAGE /HYSTEROSCOPY;  Surgeon: Ala DachJohanna K Halfon, MD;  Location: [facility] ORS;  Service: Gynecology;;    Current Outpatient Rx  Name  Route  Sig  Dispense  Refill  . aspirin 81 MG tablet   Oral   Take 81 mg by mouth daily.         . busPIRone (BUSPAR) 10 MG tablet   Oral   Take 1 tablet by mouth every evening.         . busPIRone (BUSPAR) 15 MG tablet   Oral   Take 1 tablet by mouth every morning.         . clonazePAM (KLONOPIN) 0.5 MG tablet   Oral   Take 1 tablet by mouth every evening. 0.5 tab q am         . Cranberry 450 MG CAPS   Oral   Take 450 mg by mouth 2 (two)  times daily.         . Cyanocobalamin (RA VITAMIN B-12 TR) 1000 MCG TBCR   Oral   Take 1 tablet by mouth daily.         . cyclobenzaprine (FLEXERIL) 10 MG tablet   Oral   Take 1 tablet by mouth daily. am         . dexlansoprazole (DEXILANT) 60 MG capsule   Oral   Take 60 mg by mouth daily.         Marland Kitchen gabapentin (NEURONTIN) 300 MG capsule   Oral   Take 1 capsule by mouth 3 (three) times daily.         Marland Kitchen ketorolac (ACULAR) 0.5 % ophthalmic solution   Right Eye   Place 1 drop into the right eye daily. As needed         . lidocaine (XYLOCAINE) 5 % ointment      1 application daily as needed (pain). Apply to vaginal area.         . medroxyPROGESTERone (DEPO-PROVERA) 150 MG/ML injection   Intramuscular   Inject 150 mg into the muscle every 3 (three) months.         . Melatonin 5 MG CAPS   Oral   Take by mouth.         . modafinil (PROVIGIL) 100 MG tablet   Oral   Take 1 tablet by mouth  daily.         . Multiple Vitamin (MULTI-VITAMINS) TABS   Oral   Take 1 tablet by mouth daily.         Marland Kitchen oxyCODONE-acetaminophen (PERCOCET/ROXICET) 5-325 MG tablet   Oral   Take by mouth every 6 (six) hours as needed for severe pain.         . polyethylene glycol (MIRALAX / GLYCOLAX) packet   Oral   Take 1 packet by mouth daily.         . sertraline (ZOLOFT) 100 MG tablet   Oral   Take 1 tablet by mouth daily.          Marland Kitchen topiramate (TOPAMAX) 50 MG tablet   Oral   Take 100 mg by mouth 2 (two) times daily.          . traMADol (ULTRAM) 50 MG tablet   Oral   Take 1 tablet by mouth every 8 (eight) hours as needed.          . traZODone (DESYREL) 50 MG tablet   Oral   Take 25 mg by mouth.         . Vitamins A & D (VITAMIN A & D) ointment   Topical   Apply 1 application topically as needed for dry skin. Apply to vaginal area         . docusate sodium (COLACE) 100 MG capsule   Oral   Take 1 capsule by mouth 2 (two) times daily.           Allergies Sulfa antibiotics  Family History  Problem Relation Age of Onset  . Bladder Cancer Neg Hx   . Kidney disease Neg Hx   . Prostate cancer Neg Hx     Social History Social History  Substance Use Topics  . Smoking status: Never Smoker   . Smokeless tobacco: Never Used  . Alcohol Use: No    Review of Systems  Constitutional: Negative for fever. Eyes: Negative for redness ENT: Negative for sore throat Cardiovascular: Negative for chest pain. Respiratory: Negative  for cough Gastrointestinal: Negative for vomiting Genitourinary: Negative for dysuria. Pelvic pain as above Musculoskeletal: Negative for back pain. Skin: Negative for rash. Neurological: Negative for headaches  Psychiatric: Positive for anxiety    ____________________________________________   PHYSICAL EXAM:  VITAL SIGNS: ED Triage Vitals  Enc Vitals Group     BP 09/20/15 2343 97/66 mmHg     Pulse Rate 09/20/15 2343 79      Resp 09/20/15 2343 18     Temp 09/20/15 2343 98.2 F (36.8 C)     Temp Source 09/20/15 2343 Oral     SpO2 09/20/15 2343 98 %     Weight 09/20/15 2343 103 lb (46.72 kg)     Height 09/20/15 2343 4\' 7"  (1.397 m)     Head Cir --      Peak Flow --      Pain Score 09/20/15 2345 9     Pain Loc --      Pain Edu? --      Excl. in GC? --     Constitutional: Alert and oriented. Well appearing and in no distress.  ENT   Head: Normocephalic and atraumatic.   Mouth/Throat: Mucous membranes are moist. Cardiovascular: Normal rate, regular rhythm. Normal and symmetric distal pulses are present in all extremities.  Respiratory: Normal respiratory effort without tachypnea nor retractions. Breath sounds are clear and equal bilaterally.  Gastrointestinal: Soft and non-tender in all quadrants. No distention. There is no CVA tenderness. Genitourinary: deferred per patient request Musculoskeletal: No joint swelling or redness Neurologic:  Normal speech and language.  Skin:  Skin is warm, dry and intact. No rash noted. Psychiatric: Mood and affect are normal. Patient exhibits appropriate insight and judgment.  ____________________________________________    LABS (pertinent positives/negatives)  Labs Reviewed  URINE CULTURE  URINALYSIS COMPLETEWITH MICROSCOPIC ([facility] ONLY)    ____________________________________________   EKG  None  ____________________________________________    RADIOLOGY I have personally reviewed any xrays that were ordered on this patient: Ultrasound unremarkable  ____________________________________________   PROCEDURES  Procedure(s) performed: none  Critical Care performed: none  ____________________________________________   INITIAL IMPRESSION / ASSESSMENT AND PLAN / ED COURSE  Pertinent labs & imaging results that were available during my care of the patient were reviewed by me and considered in my medical decision making (see chart for  details).  Patient with complaint of pelvic discomfort for approximately one month. She wants her IUD removed and is angry with me because I told her that we do not typically remove IUDs in the emergency department. I told her we are happy to investigate the cause of her pelvic pain and we'll start with a urinalysis given that she has had UTIs in the past and we'll do an ultrasound to examine her uterus. She does not want a pelvic exam performed in the emergency department  ----------------------------------------- 3:02 AM on 09/21/2015 -----------------------------------------  Ultrasound unremarkable, blood noted in the urinalysis, culture sent. Patient received by mouth pain medication in the emergency department. She is comfortable and her vitals are normal. This appears to be a somewhat long-standing problem for her and I think at this time discharge is appropriate with gynecology follow-up. I discussed this with her and she agrees.  ____________________________________________   FINAL CLINICAL IMPRESSION(S) / ED DIAGNOSES  Final diagnoses:  IUD (intrauterine device) in place     Jene Every, MD 09/21/15 [phone]

## 2015-09-21 NOTE — Discharge Instructions (Signed)

## 2015-09-22 LAB — URINE CULTURE

## 2015-11-21 ENCOUNTER — Other Ambulatory Visit
Admission: RE | Admit: 2015-11-21 | Discharge: 2015-11-21 | Disposition: A | Discharge status: Home / Self Care | Payer: Medicare Other | Source: Ambulatory Visit | Attending: Family Medicine | Admitting: Family Medicine

## 2015-11-21 DIAGNOSIS — R300 Dysuria: Secondary | ICD-10-CM | POA: Insufficient documentation

## 2015-11-21 LAB — URINALYSIS COMPLETE WITH MICROSCOPIC (ARMC ONLY)
BACTERIA UA: NONE SEEN
BILIRUBIN URINE: NEGATIVE
Glucose, UA: NEGATIVE mg/dL
HGB URINE DIPSTICK: NEGATIVE
Ketones, ur: NEGATIVE mg/dL
Leukocytes, UA: NEGATIVE
Nitrite: NEGATIVE
PH: 6 (ref 5.0–8.0)
PROTEIN: NEGATIVE mg/dL
RBC / HPF: NONE SEEN RBC/hpf (ref 0–5)
Specific Gravity, Urine: 1.016 (ref 1.005–1.030)

## 2015-11-23 LAB — URINE CULTURE

## 2017-01-12 IMAGING — CR DG CHEST 2V
2 series · 2 of 2 positions shown · non-contrast
Comparison: Chest radiograph and CTA of the chest performed
10/03/2014

CLINICAL DATA: Acute onset of generalized chest pain and nausea.
Initial encounter.

EXAM:
CHEST  2 VIEW

[chest lat]
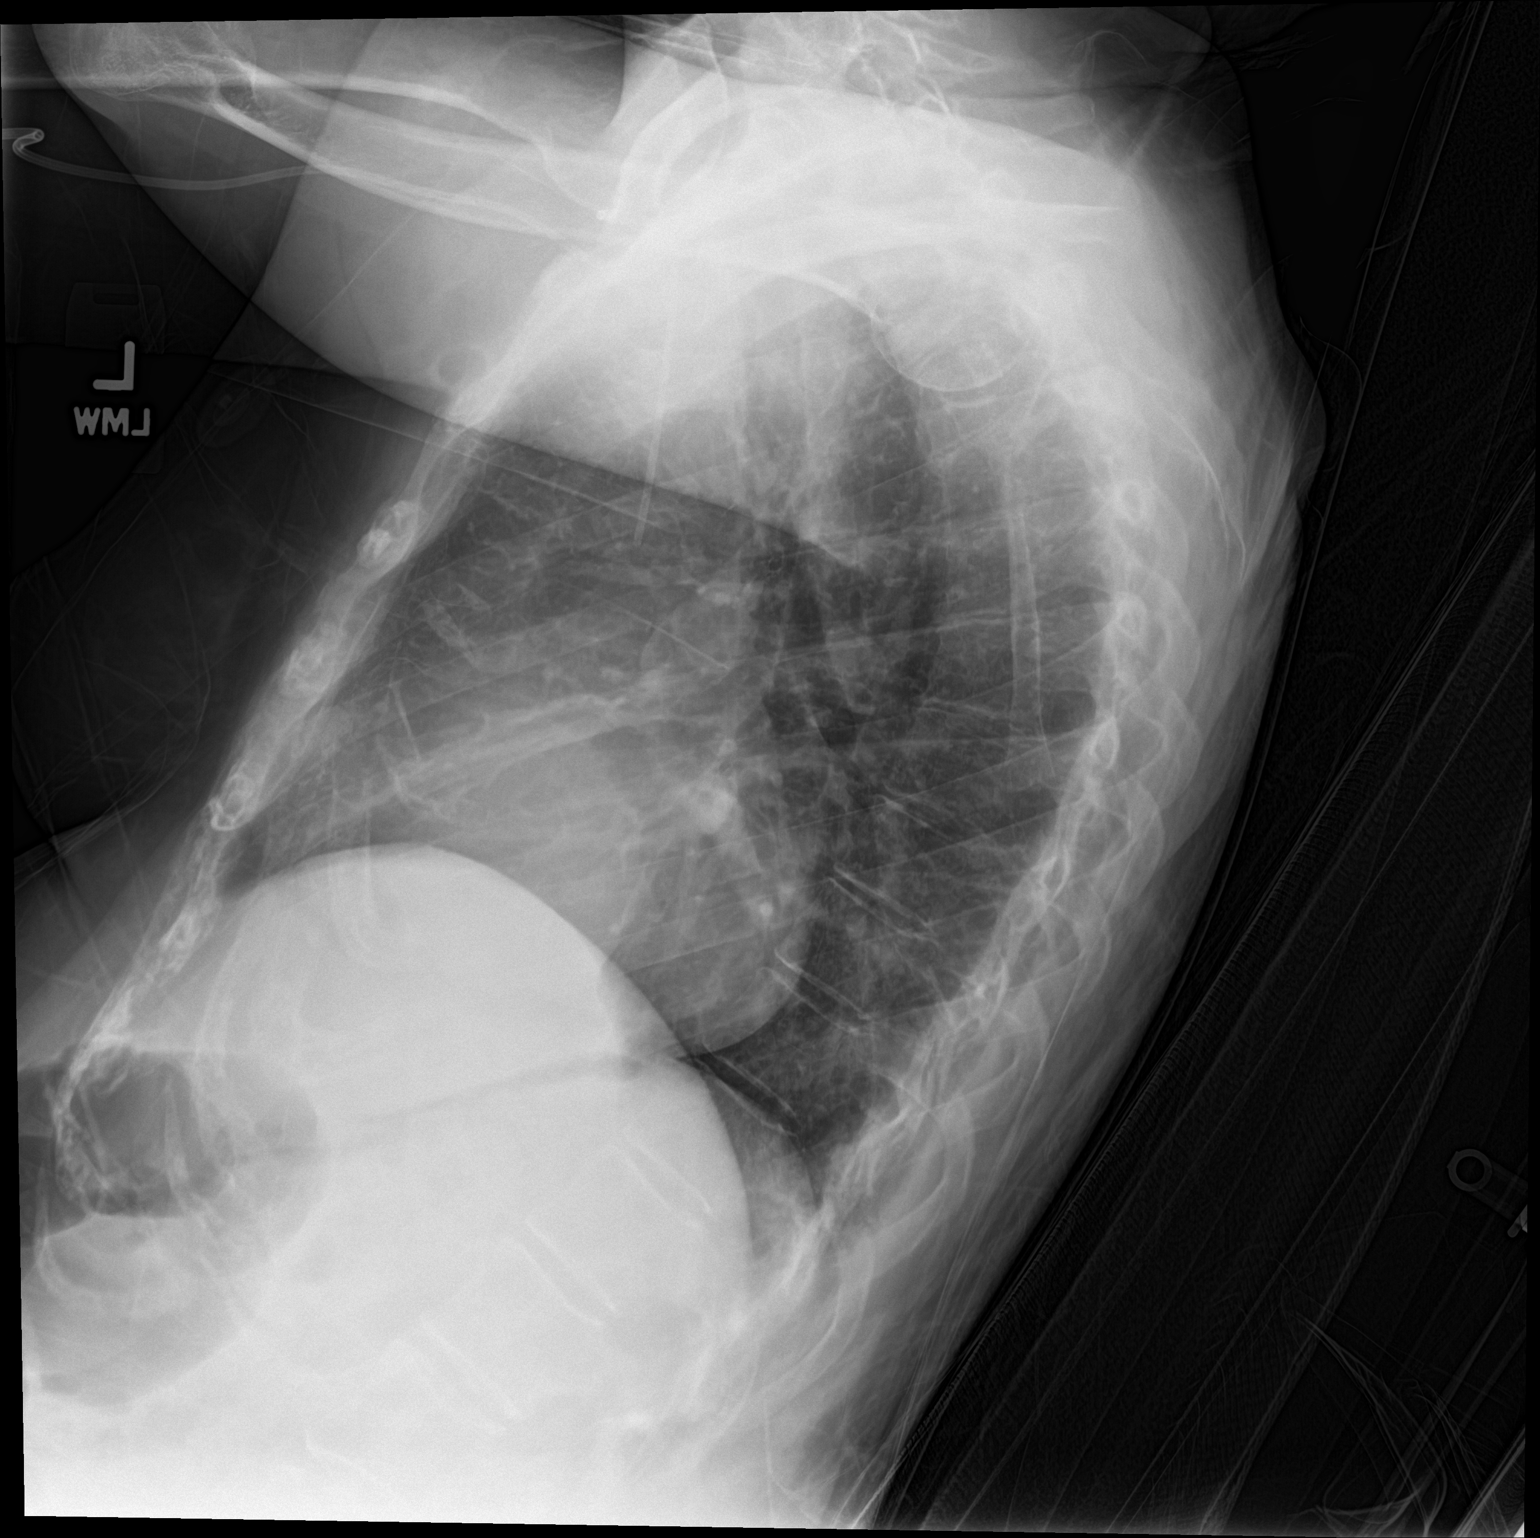

[chest ap]
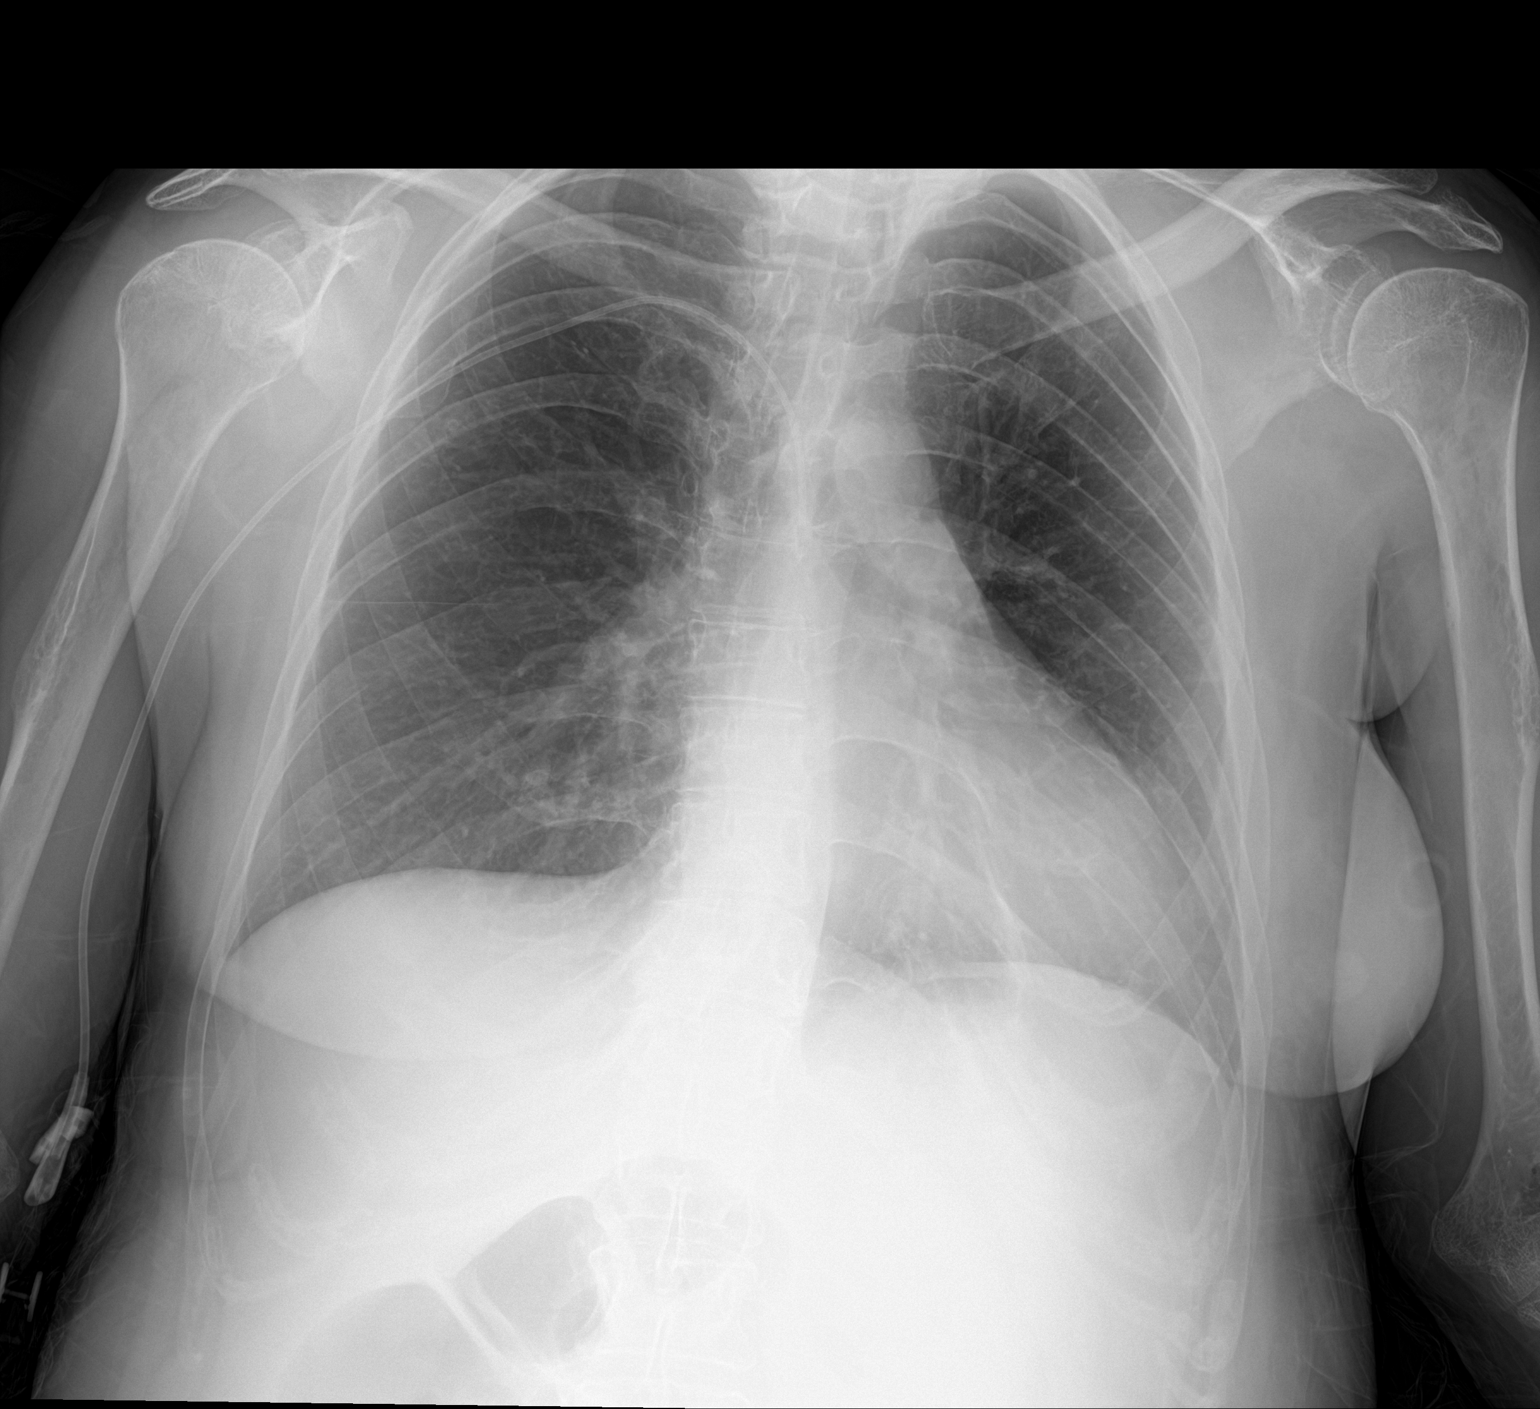

[2 of 2 positions shown; findings below may reference images not displayed]

FINDINGS: The lungs are well-aerated. Minimal left basilar atelectasis is
noted. There is no evidence of pleural effusion or pneumothorax.

The heart is normal in size; the mediastinal contour is within
normal limits. No acute osseous abnormalities are seen. A right PICC
is noted ending about the mid SVC.
IMPRESSION: Minimal left basilar atelectasis noted.  Lungs otherwise clear.

## 2017-01-12 IMAGING — CT CT HEAD W/O CM
1 of 2 series · 13 of 30 positions shown, 17 images · non-contrast
Comparison: None.

CLINICAL DATA: Seizures and headache ; cerebral palsy

EXAM:
CT HEAD WITHOUT CONTRAST
TECHNIQUE: Contiguous axial images were obtained from the base of the skull
through the vertex without intravenous contrast.

[Series 2: head wo · axial · 0.42mm/px · z∈[-176,-56]mm · 13 of 29 slices shown, 17 images]
[im 3/29  brain]
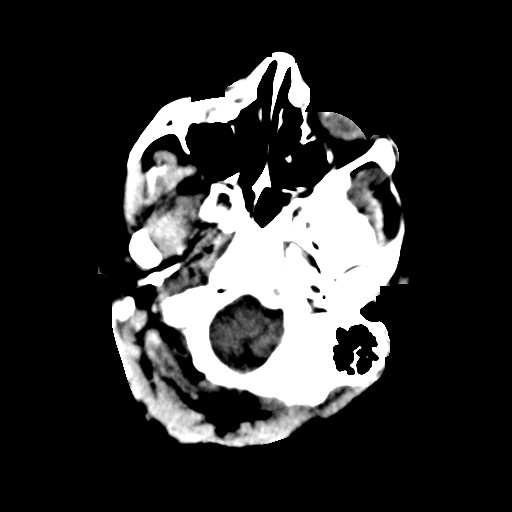
[im 3/29  bone]
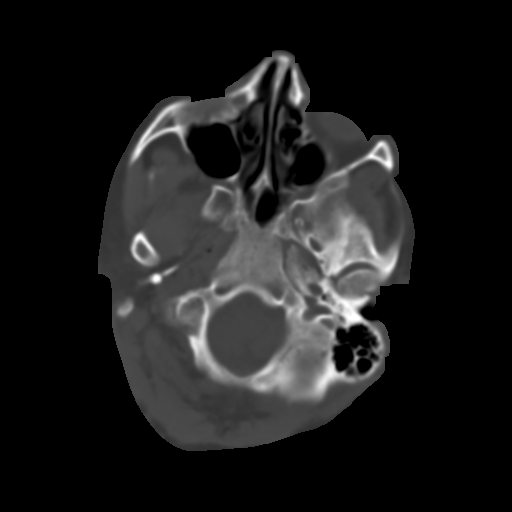
[im 5/29  brain]
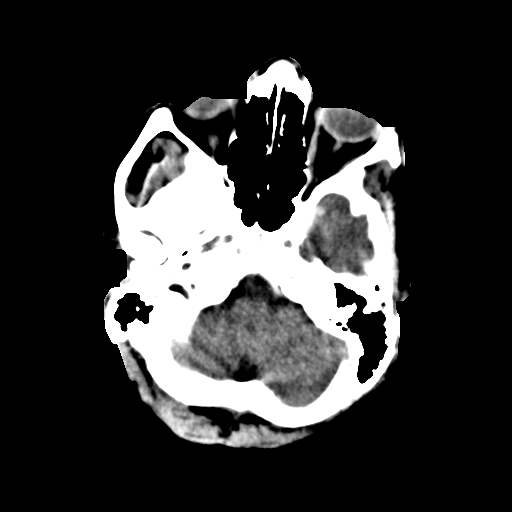
[im 7/29  brain]
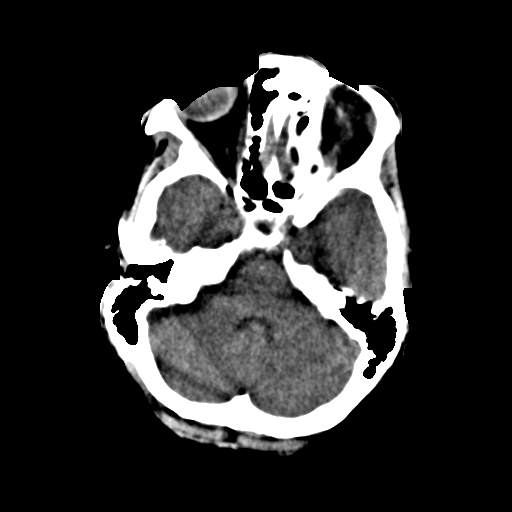
[im 9/29  brain]
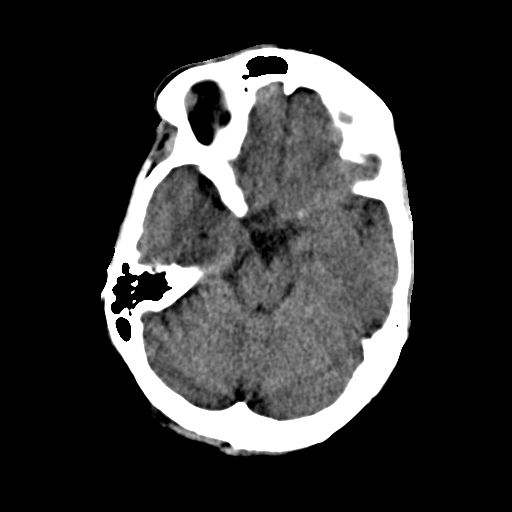
[im 11/29  brain]
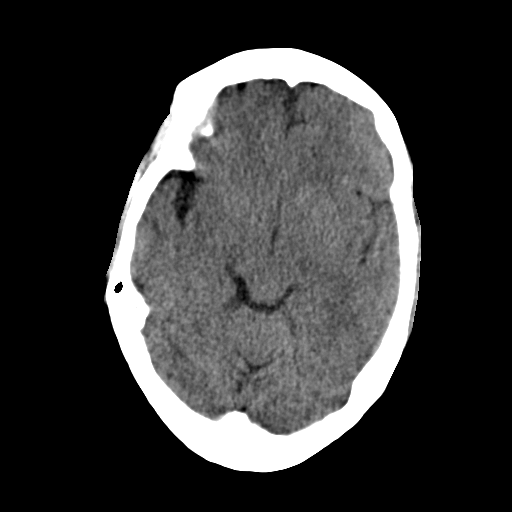
[im 11/29  bone]
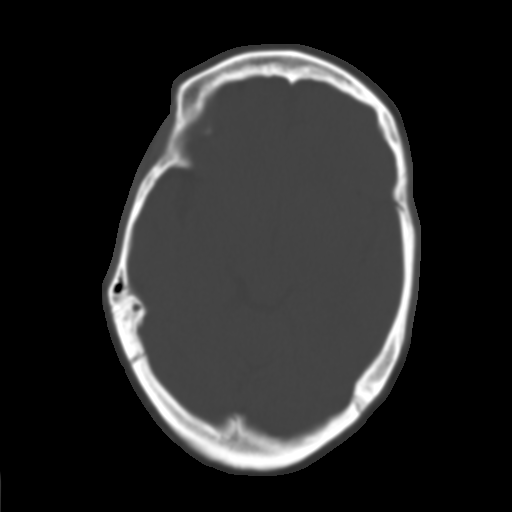
[im 13/29  brain]
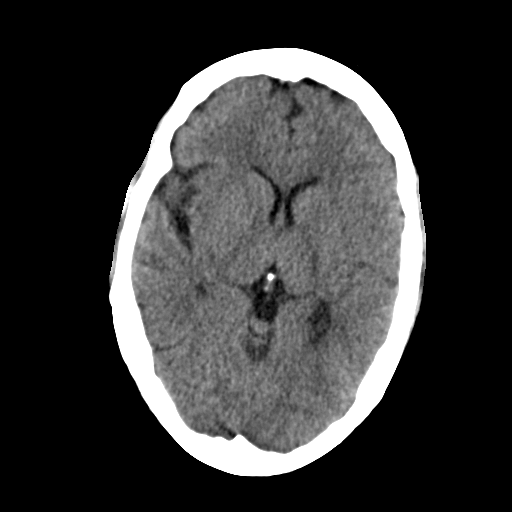
[im 15/29  brain]
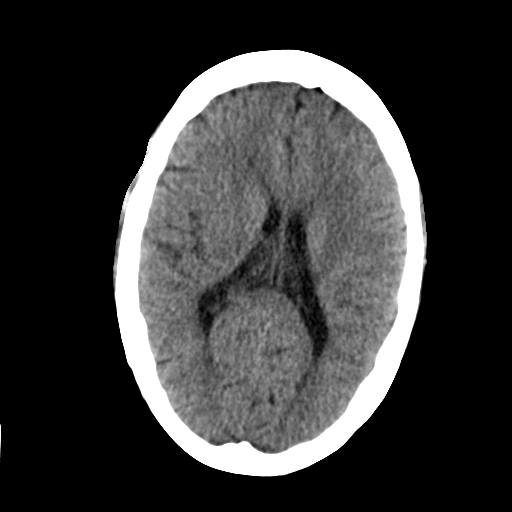
[im 17/29  brain]
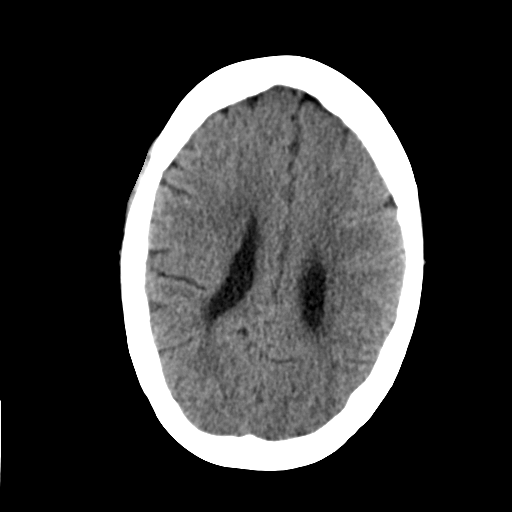
[im 19/29  brain]
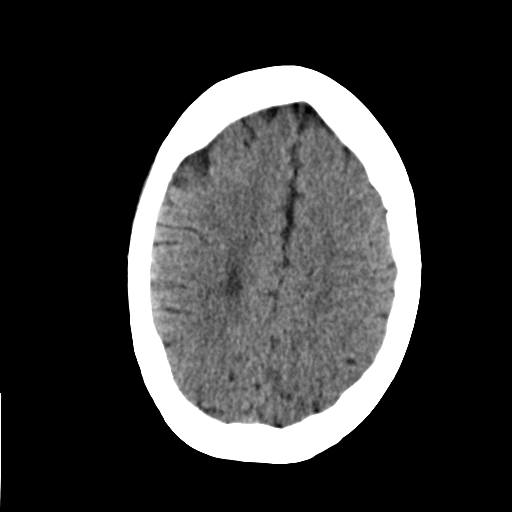
[im 19/29  bone]
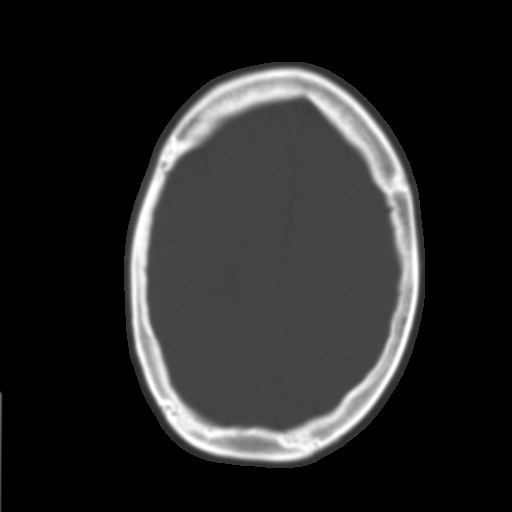
[im 21/29  brain]
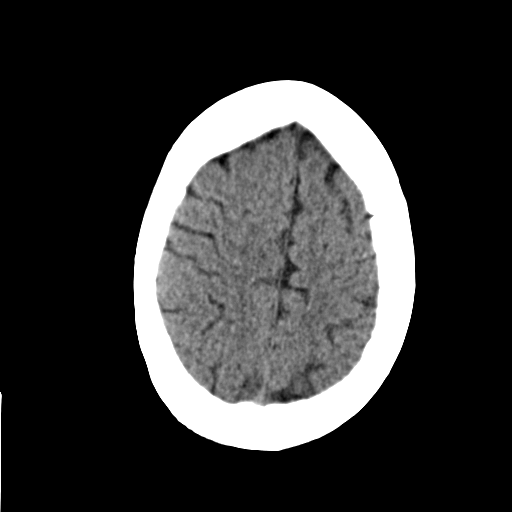
[im 23/29  brain]
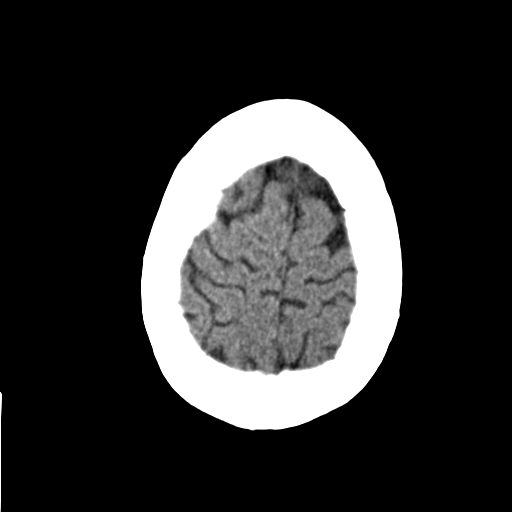
[im 25/29  brain]
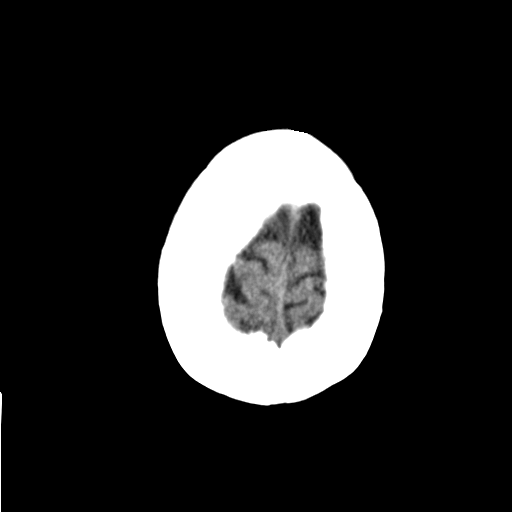
[im 27/29  brain]
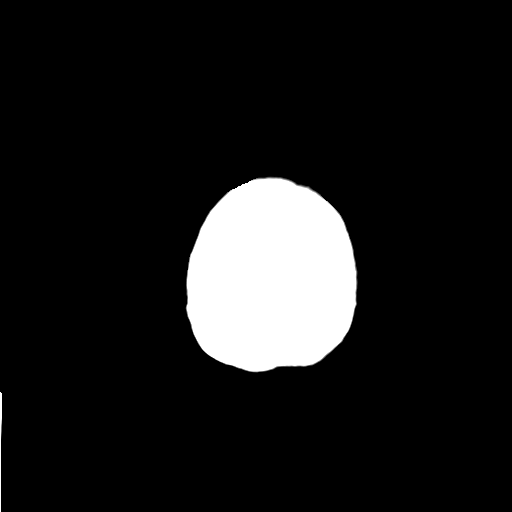
[im 27/29  bone]
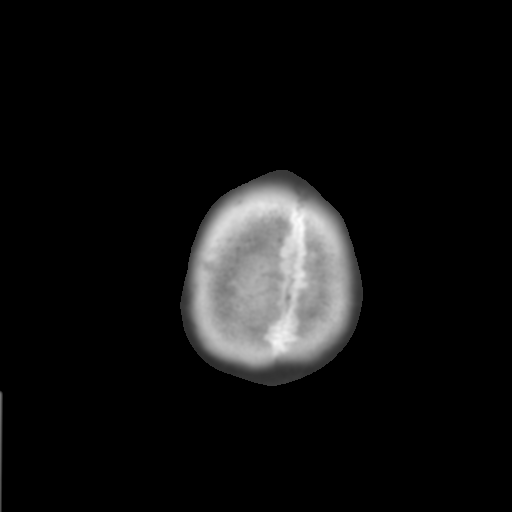

[13 of 30 positions shown; findings below may reference images not displayed]

FINDINGS: The ventricles are normal in size and configuration. There is no
intracranial mass, hemorrhage, extra-axial fluid collection, or
midline shift. The gray-white compartments are normal. No acute
infarct evident. The bony calvarium appears intact. The mastoid air
cells are clear.
IMPRESSION: Study within normal limits.

## 2017-01-12 IMAGING — CT CT ANGIO CHEST
1 of 2 series · 18 of 30 positions shown · IV contrast (APPLIED)
Comparison: Chest radiograph performed earlier today at [DATE] a.m.,
and CTA of the chest performed 10/03/2014

CLINICAL DATA: Acute onset of generalized chest pain and shortness
of breath. Initial encounter.

EXAM:
CT ANGIOGRAPHY CHEST WITH CONTRAST
TECHNIQUE: Multidetector CT imaging of the chest was performed using the
standard protocol during bolus administration of intravenous
contrast. Multiplanar CT image reconstructions and MIPs were
obtained to evaluate the vascular anatomy.
CONTRAST:  75mL OMNIPAQUE IOHEXOL 350 MG/ML SOLN

[Series 6: pe 1.0 thins · axial · 0.64mm/px · z∈[-212,-8]mm · 18 of 230 slices shown]
[im 13/230  lung]
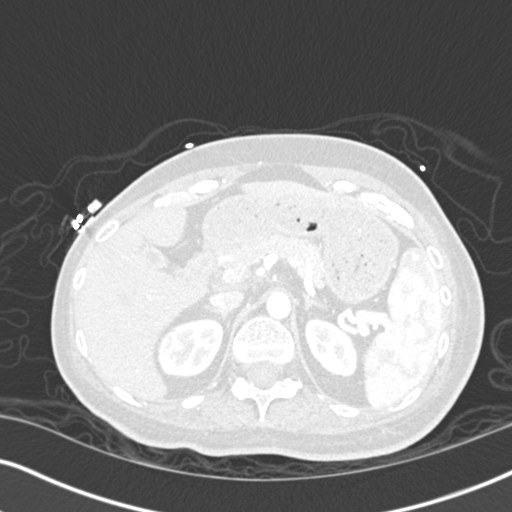
[im 26/230  mediastinal]
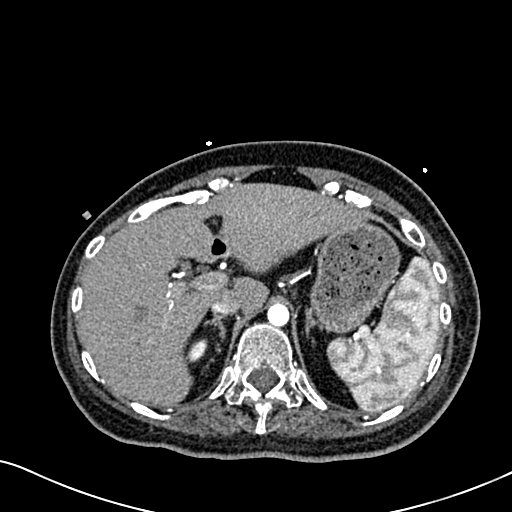
[im 39/230  lung]
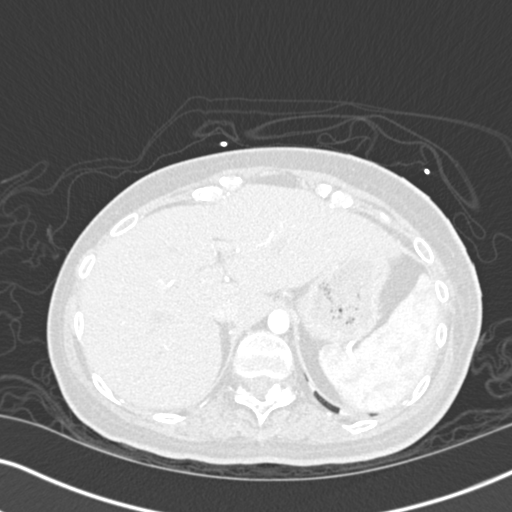
[im 51/230  mediastinal]
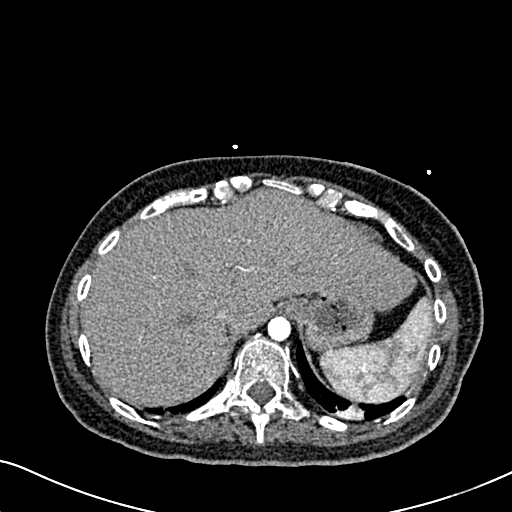
[im 64/230  lung]
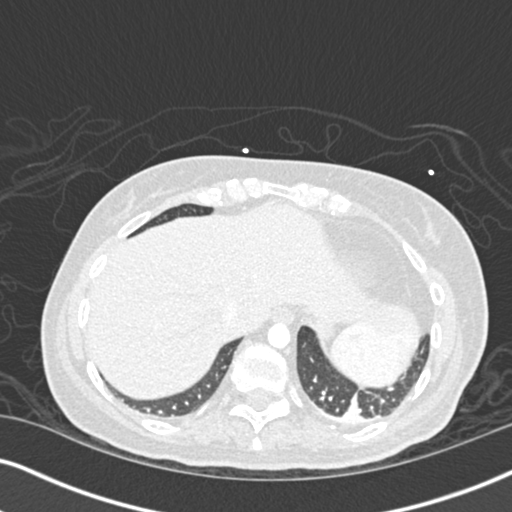
[im 77/230  mediastinal]
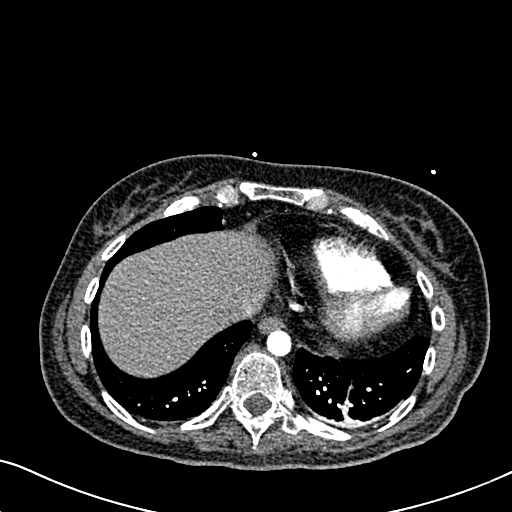
[im 90/230  lung]
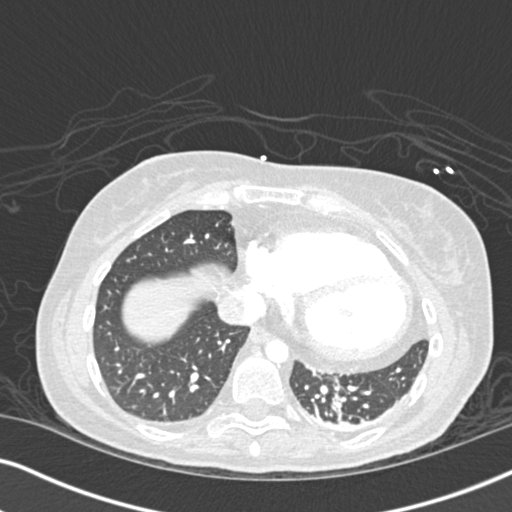
[im 102/230  mediastinal]
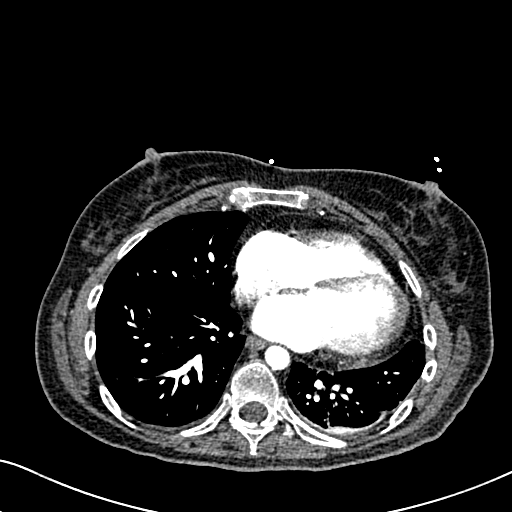
[im 106/230  lung]
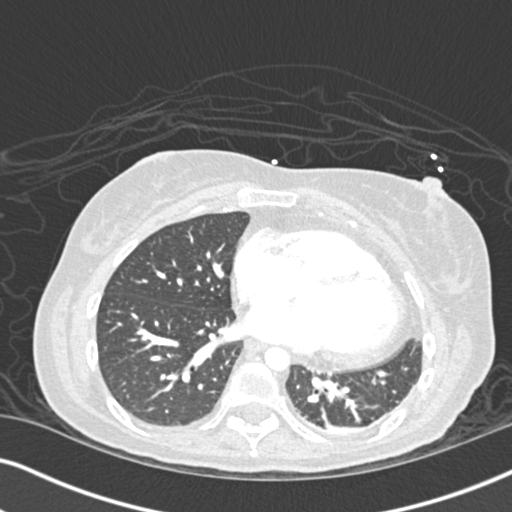
[im 115/230  mediastinal]
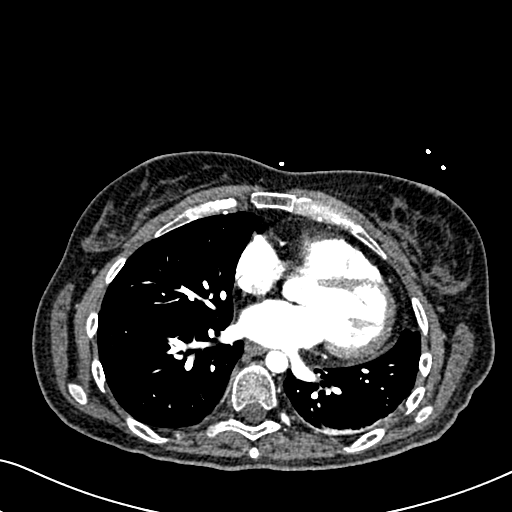
[im 128/230  lung]
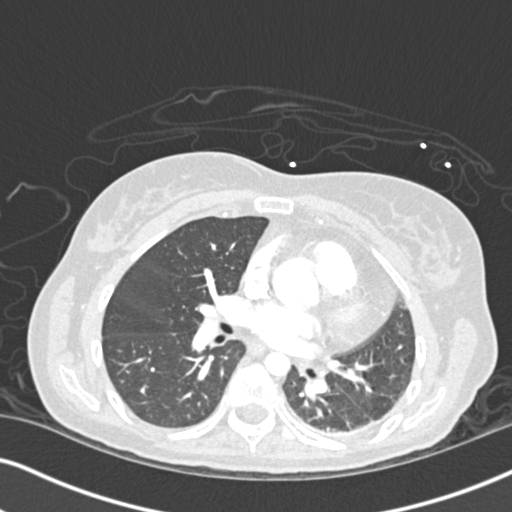
[im 140/230  mediastinal]
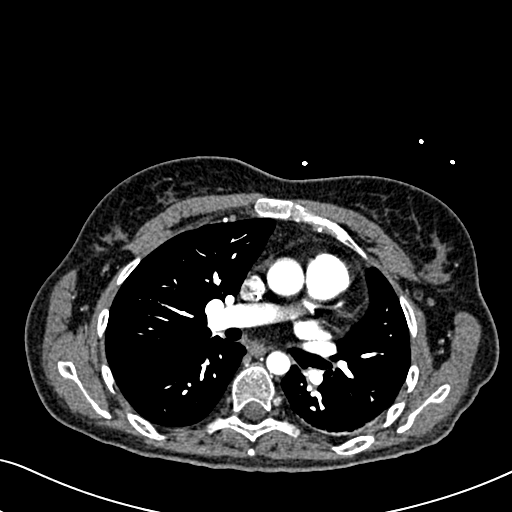
[im 153/230  lung]
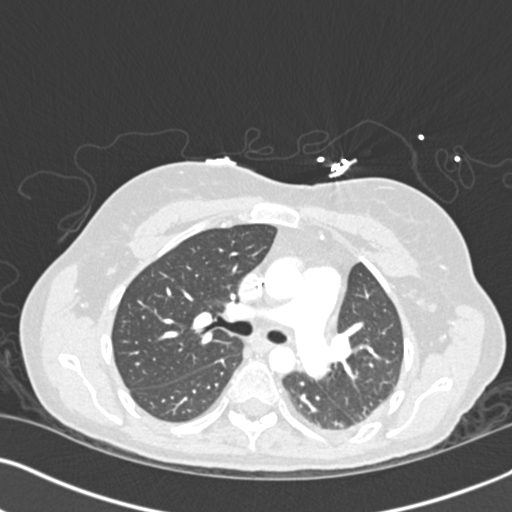
[im 166/230  mediastinal]
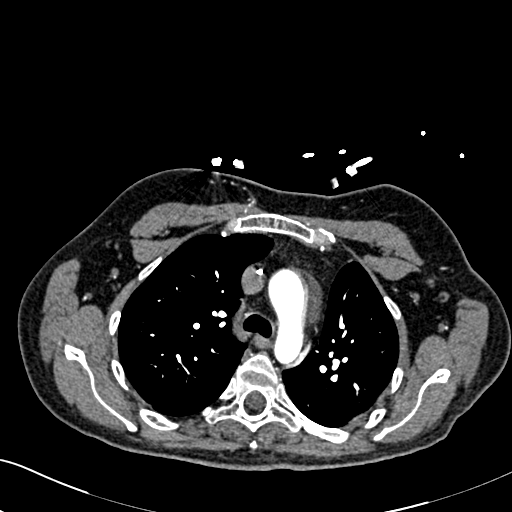
[im 179/230  lung]
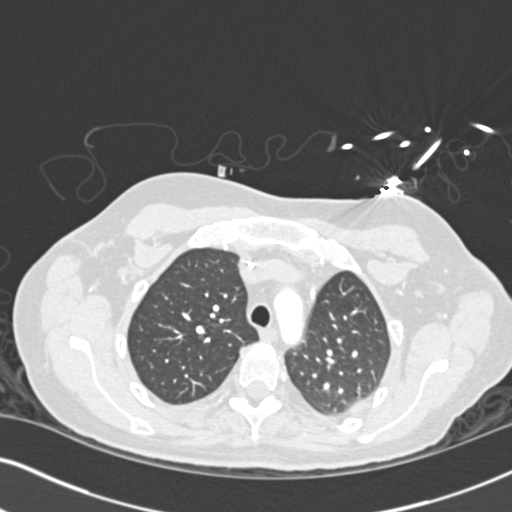
[im 191/230  mediastinal]
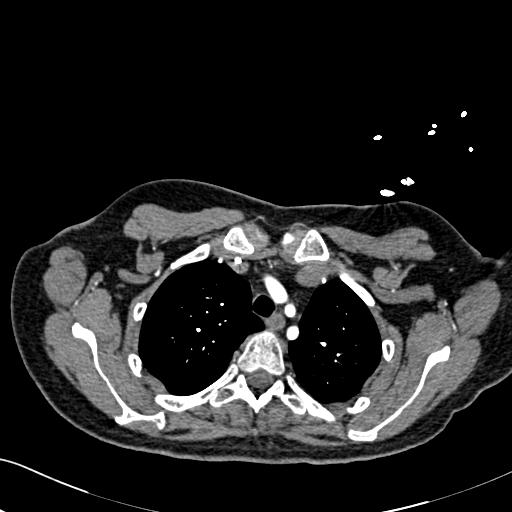
[im 204/230  lung]
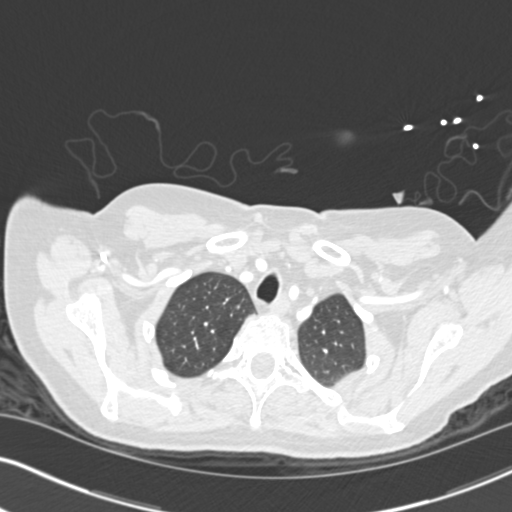
[im 217/230  mediastinal]
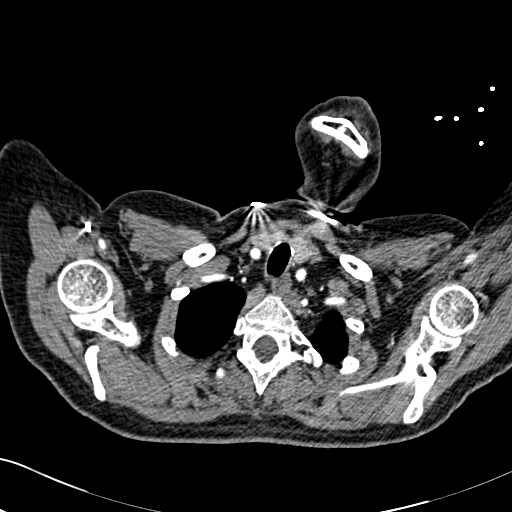

[18 of 30 positions shown; findings below may reference images not displayed]

FINDINGS: There is no evidence of pulmonary embolus.

Minimal left basilar atelectasis or scarring is noted. The right
lung appears grossly clear. There is no evidence of significant
focal consolidation, pleural effusion or pneumothorax. No masses are
identified; no abnormal focal contrast enhancement is seen.

The patient's right PICC is noted ending about the distal SVC. The
mediastinum is unremarkable in appearance. No mediastinal
lymphadenopathy is seen. No pericardial effusion is identified. The
great vessels are grossly unremarkable in appearance. No axillary
lymphadenopathy is seen. The visualized portions of the thyroid
gland are unremarkable in appearance.

The visualized portions of the liver and spleen are unremarkable.
The visualized portions of the pancreas, gallbladder, stomach,
adrenal glands and kidneys are within normal limits.

No acute osseous abnormalities are seen.

Review of the MIP images confirms the above findings.
IMPRESSION: 1. No evidence of pulmonary embolus.
2. Minimal left basilar atelectasis or scarring noted. Lungs
otherwise clear.

## 2019-09-04 ENCOUNTER — Telehealth: Payer: Self-pay | Admitting: Gastroenterology

## 2019-09-04 NOTE — Telephone Encounter (Signed)
DOD 09/04/19  Dr. Bryan Lemma, there is a referral from Dr. Jannette Fogo at [facility] to evaluate Amanda Clay for gaseous distention.  Records will be sent to you for review.   Please advise scheduling.

## 2019-09-05 NOTE — Telephone Encounter (Signed)
Records received from [facility] with the following information: -Note dated 08/26/2019-gaseous distention.  Evaluating with KUB to review.  Maintaining clear liquid diet and GI referral. -KUB (08/26/2019): Demonstrates gaseous distention of the loops of bowels and rectosigmoid, with minimal change from KUB on 08/24/2019.  No air-fluid levels, no pneumoperitoneum. -KUB performed 08/31/2019: No report for review -KUB (09/03/2019): Stable mild adynamic ileus.  No pneumoperitoneum.  No change from study dated 08/31/2019 (that report not attached for review)  Past medical history: Cerebral palsy, major depressive disorder, hemiplegia, hypertension, overactive bladder Allergies: Sulfa drugs, phenazopyridine  Okay to schedule appointment in the [facility] clinic with me.  Prior to appointment, please obtain BMP with magnesium and phosphorus, CBC.  Thanks.

## 2019-09-05 NOTE — Telephone Encounter (Signed)
Left message for Crystal per referral to call back and schedule an appointment with Dr.Cirigliano. Dr.Cirigliano is requesting pt have  BMP with magnesium and phosphorus, CBC done prior to appointment.

## 2019-09-10 NOTE — Telephone Encounter (Signed)
Called and spoke with Crystal,receptionist at [facility]-requested lab work be completed at their facility-orders for lab work printed and faxed for Dr. Gaylord Shih the lab work results be faxed to [phone] upon completion;  Appt scheduled for patient on 09/28/2019 at 9:20 am;

## 2019-09-10 NOTE — Telephone Encounter (Signed)
Crystal called regarding this msg and stated when was wanting to schedule appt however Dr would like labs done prior to appt. Crystal stated she can have them  done at their facility if she has orders.Please call Crystal at [phone].

## 2019-09-28 ENCOUNTER — Ambulatory Visit: Payer: Medicare Other | Admitting: Gastroenterology

## 2019-10-02 ENCOUNTER — Ambulatory Visit: Payer: Medicare Other | Admitting: Gastroenterology

## 2019-11-05 ENCOUNTER — Encounter: Payer: Self-pay | Admitting: Internal Medicine

## 2019-11-19 ENCOUNTER — Ambulatory Visit (INDEPENDENT_AMBULATORY_CARE_PROVIDER_SITE_OTHER): Payer: Medicare Other | Admitting: Gastroenterology

## 2019-11-19 ENCOUNTER — Telehealth: Payer: Self-pay

## 2019-11-19 ENCOUNTER — Encounter: Payer: Self-pay | Admitting: Gastroenterology

## 2019-11-19 ENCOUNTER — Other Ambulatory Visit: Payer: Self-pay

## 2019-11-19 VITALS — BP 100/70 | HR 96 | Temp 96.9°F | Ht <= 58 in | Wt 110.0 lb

## 2019-11-19 DIAGNOSIS — R1310 Dysphagia, unspecified: Secondary | ICD-10-CM | POA: Diagnosis not present

## 2019-11-19 DIAGNOSIS — Z7901 Long term (current) use of anticoagulants: Secondary | ICD-10-CM

## 2019-11-19 DIAGNOSIS — K5900 Constipation, unspecified: Secondary | ICD-10-CM

## 2019-11-19 DIAGNOSIS — K219 Gastro-esophageal reflux disease without esophagitis: Secondary | ICD-10-CM

## 2019-11-19 DIAGNOSIS — R1013 Epigastric pain: Secondary | ICD-10-CM | POA: Diagnosis not present

## 2019-11-19 DIAGNOSIS — Z8669 Personal history of other diseases of the nervous system and sense organs: Secondary | ICD-10-CM

## 2019-11-19 MED ORDER — CLENPIQ 10-3.5-12 MG-GM -GM/160ML PO SOLN: 1.0000 | Freq: Once | ORAL | 0 refills | Status: AC

## 2019-11-19 NOTE — Telephone Encounter (Signed)
     Request for surgical clearance:     Endoscopy Procedure  What type of surgery is being performed?     Colonoscopy/ EGD  When is this surgery scheduled?     4 / What type of clearance is required ?   Pharmacy  Are there any medications that need to be held prior to surgery and how long? Llana Aliment 2 day hold  Practice name and name of physician performing surgery?      [facility]  What is your office phone and fax number?      Phone- [phone]  [phone]  Anesthesia type (None, local, MAC, general) ?       MAC

## 2019-11-19 NOTE — Patient Instructions (Addendum)
  You have been scheduled for an endoscopy and colonoscopy. Please follow the written instructions given to you at your visit today. Please pick up your prep supplies at the pharmacy within the next 1-3 days. If you use inhalers (even only as needed), please bring them with you on the day of your procedure. Your physician has requested that you go to www.startemmi.com and enter the access code given to you at your visit today. This web site gives a general overview about your procedure. However, you should still follow specific instructions given to you by our office regarding your preparation for the procedure.  Your provider has requested that you go to the basement level for lab work at [address] [place] in [place] [address]. Press "B" on the elevator. The lab is located at the first door on the left as you exit the elevator.  It was a pleasure to see you today!  Doristine Locks, D.O.   Due to recent COVID-19 restrictions implemented by our local and state authorities and in an effort to keep both patients and staff as safe as possible, our hospital system now requires COVID-19 testing prior to any scheduled hospital procedure. Please go to our [facility] location drive thru testing site ([address], [place], [place] [address]) on 12/13/19 at  1010am. There will be multiple testing areas, the first checkpoint being for pre-procedure/surgery testing. Get into the right (yellow) lane that leads to the PAT testing team. You will not be billed at the time of testing but may receive a bill later depending on your insurance. The approximate cost of the test is $100. You must agree to quarantine from the time of your testing until the procedure date on 12/17/19 . This should include staying at home with ONLY the people you live with. Avoid take-out, grocery store shopping or leaving the house for any non-emergent reason. Failure to have your COVID-19 test done on the date and time you have been  scheduled will result in cancellation of procedure. Please call our office at [phone] if you have any questions.

## 2019-11-19 NOTE — H&P (View-Only) (Signed)
Chief Complaint: Constipation, dysphagia, GERD  Referring Provider:     Consepcion Hearing, NP   HPI:    Amanda Clay is a very pleasant 39 y.o. female with a history of cerebral palsy, major depressive disorder, hemiplegia, GERD, scoliosis, history of seizures, hypertension, overactive bladder, referred to the Gastroenterology Clinic from [facility] for evaluation of constipation.  Records received from [facility] with the following information: -Note dated 08/26/2019-gaseous distention.  Evaluating with KUB to review.  Maintaining clear liquid diet and GI referral. -KUB (08/26/2019): Demonstrates gaseous distention of the loops of bowels and rectosigmoid, with minimal change from KUB on 08/24/2019.  No air-fluid levels, no pneumoperitoneum. -KUB performed 08/31/2019: No report for review -KUB (09/03/2019): Stable mild adynamic ileus.  No pneumoperitoneum.  No change from study dated 08/31/2019 (that report not attached for review) -KUB (10/04/2019): Viewable on medical aid's phone today with stable adynamic ileus -MAR reviewed  She states she has had chronic constipation for a long time, but worsening lately.  Multiple medications to include MiraLAX, Colace, lactulose, Senokot Gas-X.  Currently taking MiraLAX daily, lactulose daily, Senexon bid.  May still have considerable straining to have BM.  Without push on her abdomen in order to facilitate BM.  Consumes adequate amount of liquids.  No previous colonoscopy, but reports this was previously recommended and scheduled by a prior GI.  History of GERD, currently prescribed Dexilant 60 mg/day, which controls reflux symptoms (heartburn, regurgitation).  However, now with solid food dysphagia, pointing to lower sternum/xiphoid process, occurring with essentially every meal.  No liquid dysphagia.  No history of food impactions.  EGD years ago diagnosed with GERD PUD (no report for review).   Prescribed Reglan 5 mg before each meal, presumably to aid in reflux management.  Otherwise, no nausea/vomiting.  Prescribed multiple medications, multiple of which w/ ADR constipation: oxybutynin (9-15%), tolterodine (7%),Topamax (1-4%), Neurontin (1-4%).  TTE completed 07/2019: EF 60-65%.  Zio patch monitor completed 08/2019.  Had requested labs (CBC, BMP, Ca, mag, Phos) to be done prior to appointment-no labs for review since 04/2019.  No new imaging for review.  Labs ordered today.   Past Medical History:  Diagnosis Date  . Acute cystitis without hematuria   . Anxiety   . Cerebral palsy (HCC)   . Constipation   . Endometriosis   . GERD (gastroesophageal reflux disease)   . Hemiplegia and hemiparesis   . Hypertension   . IBS (irritable bowel syndrome)   . Insomnia   . Insomnia   . Major depressive disorder   . Migraine   . Migraine   . Muscle weakness (generalized)   . Neuropathy    poly  . Overactive bladder   . Panic attacks   . Polyneuropathy   . Polyneuropathy   . Scoliosis   . Seizures (HCC)    x 4 unknown etiology  last 3 years ago  . Shortness of breath dyspnea    when on back,scoliosis  . Stroke (HCC)    12/2012  . Urge incontinence   . Urinary retention   . Vitamin deficiency    unspecified  . Yeast vaginitis      Past Surgical History:  Procedure Laterality Date  . abducter surgery  2000  . CORONARY ANGIOPLASTY  2016  . ESOPHAGOGASTRODUODENOSCOPY     more than 10 years ago  . eye surgeries  1993  . HYSTEROSCOPY WITH D &  C  08/22/2015   Procedure: DILATATION AND CURETTAGE /HYSTEROSCOPY;  Surgeon: Johanna K Halfon, MD;  Location: [facility] ORS;  Service: Gynecology;;  . INTRAUTERINE DEVICE (IUD) INSERTION N/A 08/22/2015   Procedure: INTRAUTERINE DEVICE (IUD) INSERTION;  Surgeon: Johanna K Halfon, MD;  Location: [facility] ORS;  Service: Gynecology;  Laterality: N/A;  . leg surgeries  2008  . MELANOMA EXCISION  2001  . PERIPHERAL VASCULAR CATHETERIZATION N/A  04/02/2015   Procedure: PICC Line Insertion;  Surgeon: Gregory G Schnier, MD;  Location: [facility] INVASIVE CV LAB;  Service: Cardiovascular;  Laterality: N/A;   Family History  Problem Relation Age of Onset  . Bone cancer Father   . Lung cancer Father   . Heart disease Father        quadable by pass  . Clotting disorder Paternal Grandmother   . Colon cancer Cousin   . Bladder Cancer Neg Hx   . Kidney disease Neg Hx   . Prostate cancer Neg Hx    Social History   Tobacco Use  . Smoking status: Never Smoker  . Smokeless tobacco: Never Used  Substance Use Topics  . Alcohol use: No  . Drug use: No   Current Outpatient Medications  Medication Sig Dispense Refill  . clonazePAM (KLONOPIN) 0.5 MG tablet Take 0.5 mg by mouth 2 (two) times daily.     . Cranberry 450 MG CAPS Take 450 mg by mouth 2 (two) times daily.    . dexlansoprazole (DEXILANT) 60 MG capsule Take 60 mg by mouth daily.    . medroxyPROGESTERone (DEPO-PROVERA) 150 MG/ML injection Inject 150 mg into the muscle every 3 (three) months.    . Multiple Vitamin (MULTI-VITAMINS) TABS Take 1 tablet by mouth daily.    . oxybutynin (DITROPAN) 5 MG tablet Take 5 mg by mouth daily.    . prazosin (MINIPRESS) 1 MG capsule Take 1 mg by mouth at bedtime.    . rivaroxaban (XARELTO) 20 MG TABS tablet Take 20 mg by mouth daily.    . topiramate (TOPAMAX) 50 MG tablet Take 200 mg by mouth daily.     . busPIRone (BUSPAR) 10 MG tablet Take 1 tablet by mouth every evening.    . cyclobenzaprine (FLEXERIL) 10 MG tablet Take 1 tablet by mouth daily. am    . docusate sodium (COLACE) 100 MG capsule Take 1 capsule by mouth 2 (two) times daily.    . gabapentin (NEURONTIN) 300 MG capsule Take 1 capsule by mouth 3 (three) times daily.    . ketorolac (ACULAR) 0.5 % ophthalmic solution Place 1 drop into the right eye daily. As needed    . lidocaine (XYLOCAINE) 5 % ointment 1 application daily as needed (pain). Apply to vaginal area.    . Melatonin 5 MG CAPS  Take by mouth.    . modafinil (PROVIGIL) 100 MG tablet Take 1 tablet by mouth daily.    . oxyCODONE-acetaminophen (PERCOCET/ROXICET) 5-325 MG tablet Take by mouth every 6 (six) hours as needed for severe pain.    . sertraline (ZOLOFT) 100 MG tablet Take 1 tablet by mouth daily.     . traMADol (ULTRAM) 50 MG tablet Take 1 tablet by mouth every 8 (eight) hours as needed.     . traZODone (DESYREL) 50 MG tablet Take 25 mg by mouth.    . Vitamins A & D (VITAMIN A & D) ointment Apply 1 application topically as needed for dry skin. Apply to vaginal area     No current facility-administered medications for   this visit.   Allergies  Allergen Reactions  . Penicillin G Swelling  . Sulfa Antibiotics Hives, Itching and Swelling     Review of Systems: All systems reviewed and negative except where noted in HPI.     Physical Exam:    Wt Readings from Last 3 Encounters:  11/19/19 110 lb (49.9 kg)  09/20/15 103 lb (46.7 kg)  08/22/15 107 lb (48.5 kg)    BP 100/70   Pulse 96   Temp (!) 96.9 F (36.1 C)   Ht 4\' 6"  (1.372 m)   Wt 110 lb (49.9 kg) Comment: verbalized  BMI 26.52 kg/m    Constitutional:  Pleasant, in no acute distress.  Sitting in wheelchair.  Well conversive Psychiatric: Normal mood and affect. Behavior is normal. EENT: Pupils normal.  Conjunctivae are normal. No scleral icterus. Abdominal: Soft, nondistended, nontender.  Neurological: Alert and oriented to person place and time. Skin: Skin is warm and dry. No rashes noted.   ASSESSMENT AND PLAN;   1) Chronic constipation: -Colonoscopy to evaluate for mucosal/luminal pathology -Colonoscopy unrevealing, plan for ARM for evaluation of possible pelvic floor dyssynergia overlap -Check BMP, mag, Phos, and CBC -Resume current medications -Resume IV fluid intake and high-fiber diet  2) Dysphagia 3) GERD 4) Epigastric pain/History of PUD -EGD to evaluate for esophageal stricture, luminal narrowing, etc. with esophageal  dilation as appropriate -Evaluate for erosive esophagitis, LES laxity, hiatal hernia at time of EGD -Additional etiology includes 2/2 scoliosis/MSK, presbyesophagus, motility disorder, etc. -Gastric biopsies to rule out H. pylori -Prescribed Reglan, presumably due to reflux.  Pending endoscopic findings, may look into titrating off if possible  5) Systemic anticoagulation Hold Xarelto to days before procedure - will instruct when and how to resume after procedure. Low but real risk of cardiovascular event such as heart attack, stroke, embolism, thrombosis or ischemia/infarct of other organs off Xarelto explained and need to seek urgent help if this occurs. The patient consents to proceed. Will communicate by phone or EMR with patient's prescribing provider to confirm that holding Xarelto is reasonable in this case  6) Cerebral palsy -Elevated procedural risks due to underlying cerebral palsy.  Plan for EGD and colonoscopy to be done at [facility]  The indications, risks, and benefits of EGD and colonoscopy were explained to the patient in detail. Risks include but are not limited to bleeding, perforation, adverse reaction to medications, and cardiopulmonary compromise. Sequelae include but are not limited to the possibility of surgery, hositalization, and mortality. The patient verbalized understanding and wished to proceed. All questions answered, referred to scheduler and bowel prep ordered. Further recommendations pending results of the exam.       Dominic Pea Alvah Gilder, DO, FACG  11/19/2019, 11:03 AM   [place], Ivette Loyal*

## 2019-11-19 NOTE — Progress Notes (Signed)
Chief Complaint: Constipation, dysphagia, GERD  Referring Provider:     Consepcion Hearing, NP   HPI:    Amanda Clay is a very pleasant 39 y.o. female with a history of cerebral palsy, major depressive disorder, hemiplegia, GERD, scoliosis, history of seizures, hypertension, overactive bladder, referred to the Gastroenterology Clinic from [facility] for evaluation of constipation.  Records received from [facility] with the following information: -Note dated 08/26/2019-gaseous distention.  Evaluating with KUB to review.  Maintaining clear liquid diet and GI referral. -KUB (08/26/2019): Demonstrates gaseous distention of the loops of bowels and rectosigmoid, with minimal change from KUB on 08/24/2019.  No air-fluid levels, no pneumoperitoneum. -KUB performed 08/31/2019: No report for review -KUB (09/03/2019): Stable mild adynamic ileus.  No pneumoperitoneum.  No change from study dated 08/31/2019 (that report not attached for review) -KUB (10/04/2019): Viewable on medical aid's phone today with stable adynamic ileus -MAR reviewed  She states she has had chronic constipation for a long time, but worsening lately.  Multiple medications to include MiraLAX, Colace, lactulose, Senokot Gas-X.  Currently taking MiraLAX daily, lactulose daily, Senexon bid.  May still have considerable straining to have BM.  Without push on her abdomen in order to facilitate BM.  Consumes adequate amount of liquids.  No previous colonoscopy, but reports this was previously recommended and scheduled by a prior GI.  History of GERD, currently prescribed Dexilant 60 mg/day, which controls reflux symptoms (heartburn, regurgitation).  However, now with solid food dysphagia, pointing to lower sternum/xiphoid process, occurring with essentially every meal.  No liquid dysphagia.  No history of food impactions.  EGD years ago diagnosed with GERD PUD (no report for review).   Prescribed Reglan 5 mg before each meal, presumably to aid in reflux management.  Otherwise, no nausea/vomiting.  Prescribed multiple medications, multiple of which w/ ADR constipation: oxybutynin (9-15%), tolterodine (7%),Topamax (1-4%), Neurontin (1-4%).  TTE completed 07/2019: EF 60-65%.  Zio patch monitor completed 08/2019.  Had requested labs (CBC, BMP, Ca, mag, Phos) to be done prior to appointment-no labs for review since 04/2019.  No new imaging for review.  Labs ordered today.   Past Medical History:  Diagnosis Date  . Acute cystitis without hematuria   . Anxiety   . Cerebral palsy (HCC)   . Constipation   . Endometriosis   . GERD (gastroesophageal reflux disease)   . Hemiplegia and hemiparesis   . Hypertension   . IBS (irritable bowel syndrome)   . Insomnia   . Insomnia   . Major depressive disorder   . Migraine   . Migraine   . Muscle weakness (generalized)   . Neuropathy    poly  . Overactive bladder   . Panic attacks   . Polyneuropathy   . Polyneuropathy   . Scoliosis   . Seizures (HCC)    x 4 unknown etiology  last 3 years ago  . Shortness of breath dyspnea    when on back,scoliosis  . Stroke (HCC)    12/2012  . Urge incontinence   . Urinary retention   . Vitamin deficiency    unspecified  . Yeast vaginitis      Past Surgical History:  Procedure Laterality Date  . abducter surgery  2000  . CORONARY ANGIOPLASTY  2016  . ESOPHAGOGASTRODUODENOSCOPY     more than 10 years ago  . eye surgeries  1993  . HYSTEROSCOPY WITH D &  C  08/22/2015   Procedure: DILATATION AND CURETTAGE /HYSTEROSCOPY;  Surgeon: Ala Dach, MD;  Location: [facility] ORS;  Service: Gynecology;;  . INTRAUTERINE DEVICE (IUD) INSERTION N/A 08/22/2015   Procedure: INTRAUTERINE DEVICE (IUD) INSERTION;  Surgeon: Ala Dach, MD;  Location: [facility] ORS;  Service: Gynecology;  Laterality: N/A;  . leg surgeries  2008  . MELANOMA EXCISION  2001  . PERIPHERAL VASCULAR CATHETERIZATION N/A  04/02/2015   Procedure: PICC Line Insertion;  Surgeon: Renford Dills, MD;  Location: [facility] INVASIVE CV LAB;  Service: Cardiovascular;  Laterality: N/A;   Family History  Problem Relation Age of Onset  . Bone cancer Father   . Lung cancer Father   . Heart disease Father        quadable by pass  . Clotting disorder Paternal Grandmother   . Colon cancer Cousin   . Bladder Cancer Neg Hx   . Kidney disease Neg Hx   . Prostate cancer Neg Hx    Social History   Tobacco Use  . Smoking status: Never Smoker  . Smokeless tobacco: Never Used  Substance Use Topics  . Alcohol use: No  . Drug use: No   Current Outpatient Medications  Medication Sig Dispense Refill  . clonazePAM (KLONOPIN) 0.5 MG tablet Take 0.5 mg by mouth 2 (two) times daily.     . Cranberry 450 MG CAPS Take 450 mg by mouth 2 (two) times daily.    Marland Kitchen dexlansoprazole (DEXILANT) 60 MG capsule Take 60 mg by mouth daily.    . medroxyPROGESTERone (DEPO-PROVERA) 150 MG/ML injection Inject 150 mg into the muscle every 3 (three) months.    . Multiple Vitamin (MULTI-VITAMINS) TABS Take 1 tablet by mouth daily.    Marland Kitchen oxybutynin (DITROPAN) 5 MG tablet Take 5 mg by mouth daily.    . prazosin (MINIPRESS) 1 MG capsule Take 1 mg by mouth at bedtime.    . rivaroxaban (XARELTO) 20 MG TABS tablet Take 20 mg by mouth daily.    Marland Kitchen topiramate (TOPAMAX) 50 MG tablet Take 200 mg by mouth daily.     . busPIRone (BUSPAR) 10 MG tablet Take 1 tablet by mouth every evening.    . cyclobenzaprine (FLEXERIL) 10 MG tablet Take 1 tablet by mouth daily. am    . docusate sodium (COLACE) 100 MG capsule Take 1 capsule by mouth 2 (two) times daily.    Marland Kitchen gabapentin (NEURONTIN) 300 MG capsule Take 1 capsule by mouth 3 (three) times daily.    Marland Kitchen ketorolac (ACULAR) 0.5 % ophthalmic solution Place 1 drop into the right eye daily. As needed    . lidocaine (XYLOCAINE) 5 % ointment 1 application daily as needed (pain). Apply to vaginal area.    . Melatonin 5 MG CAPS  Take by mouth.    . modafinil (PROVIGIL) 100 MG tablet Take 1 tablet by mouth daily.    Marland Kitchen oxyCODONE-acetaminophen (PERCOCET/ROXICET) 5-325 MG tablet Take by mouth every 6 (six) hours as needed for severe pain.    Marland Kitchen sertraline (ZOLOFT) 100 MG tablet Take 1 tablet by mouth daily.     . traMADol (ULTRAM) 50 MG tablet Take 1 tablet by mouth every 8 (eight) hours as needed.     . traZODone (DESYREL) 50 MG tablet Take 25 mg by mouth.    . Vitamins A & D (VITAMIN A & D) ointment Apply 1 application topically as needed for dry skin. Apply to vaginal area     No current facility-administered medications for  this visit.   Allergies  Allergen Reactions  . Penicillin G Swelling  . Sulfa Antibiotics Hives, Itching and Swelling     Review of Systems: All systems reviewed and negative except where noted in HPI.     Physical Exam:    Wt Readings from Last 3 Encounters:  11/19/19 110 lb (49.9 kg)  09/20/15 103 lb (46.7 kg)  08/22/15 107 lb (48.5 kg)    BP 100/70   Pulse 96   Temp (!) 96.9 F (36.1 C)   Ht 4\' 6"  (1.372 m)   Wt 110 lb (49.9 kg) Comment: verbalized  BMI 26.52 kg/m    Constitutional:  Pleasant, in no acute distress.  Sitting in wheelchair.  Well conversive Psychiatric: Normal mood and affect. Behavior is normal. EENT: Pupils normal.  Conjunctivae are normal. No scleral icterus. Abdominal: Soft, nondistended, nontender.  Neurological: Alert and oriented to person place and time. Skin: Skin is warm and dry. No rashes noted.   ASSESSMENT AND PLAN;   1) Chronic constipation: -Colonoscopy to evaluate for mucosal/luminal pathology -Colonoscopy unrevealing, plan for ARM for evaluation of possible pelvic floor dyssynergia overlap -Check BMP, mag, Phos, and CBC -Resume current medications -Resume IV fluid intake and high-fiber diet  2) Dysphagia 3) GERD 4) Epigastric pain/History of PUD -EGD to evaluate for esophageal stricture, luminal narrowing, etc. with esophageal  dilation as appropriate -Evaluate for erosive esophagitis, LES laxity, hiatal hernia at time of EGD -Additional etiology includes 2/2 scoliosis/MSK, presbyesophagus, motility disorder, etc. -Gastric biopsies to rule out H. pylori -Prescribed Reglan, presumably due to reflux.  Pending endoscopic findings, may look into titrating off if possible  5) Systemic anticoagulation Hold Xarelto to days before procedure - will instruct when and how to resume after procedure. Low but real risk of cardiovascular event such as heart attack, stroke, embolism, thrombosis or ischemia/infarct of other organs off Xarelto explained and need to seek urgent help if this occurs. The patient consents to proceed. Will communicate by phone or EMR with patient's prescribing provider to confirm that holding Xarelto is reasonable in this case  6) Cerebral palsy -Elevated procedural risks due to underlying cerebral palsy.  Plan for EGD and colonoscopy to be done at [facility]  The indications, risks, and benefits of EGD and colonoscopy were explained to the patient in detail. Risks include but are not limited to bleeding, perforation, adverse reaction to medications, and cardiopulmonary compromise. Sequelae include but are not limited to the possibility of surgery, hositalization, and mortality. The patient verbalized understanding and wished to proceed. All questions answered, referred to scheduler and bowel prep ordered. Further recommendations pending results of the exam.       Dominic Pea Sabien Umland, DO, FACG  11/19/2019, 11:03 AM   [place], Ivette Loyal*

## 2019-11-20 ENCOUNTER — Telehealth: Payer: Self-pay | Admitting: Gastroenterology

## 2019-11-20 NOTE — Telephone Encounter (Signed)
Spoke to [employer] from [employer]. Patients [facility] procedure for 12/17/19 at 7:30am has been move up to a later time in the morning to accommodate transportation.Amanda Clay will come by the office at her convenience to pick up a sample prep of [employer]. All questions answered.

## 2019-11-20 NOTE — Telephone Encounter (Signed)
Crystal from [facility] is calling regarding patients colon/endo on 4/12. The prep that was sent in patients insurance will not cover- they told them that they will cover the Peg 3050 and asking if they can do that prep. Also, patient is scheduled on 4/12 at 7:30 and has to be there at 6:00- Crystal who works at [employer] does not get to work until 8:00 and would be the person coming with patient. She is asking for the patients appointment be moved to a time where they can arrive after 8:00.

## 2019-11-23 ENCOUNTER — Telehealth: Payer: Self-pay

## 2019-11-23 NOTE — Telephone Encounter (Signed)
-----   Message from Dossie Arbour, LPN sent at 7/34/2876 11:57 AM EDT ----- Regarding: Xarelto hold Call wanda [phone] for xarelto hold or main # [phone]. Needs 2 day hold for ECL on 12/17/19

## 2019-11-23 NOTE — Telephone Encounter (Signed)
Spoke to [place] who is patients resident care coordinator. Clearance has been given from MD at [facility] to hold Xarelto 2 days prior to 12/17/19 colonoscopy. All questions answered.

## 2019-12-05 DIAGNOSIS — K567 Ileus, unspecified: Secondary | ICD-10-CM

## 2019-12-06 DIAGNOSIS — K567 Ileus, unspecified: Secondary | ICD-10-CM | POA: Diagnosis not present

## 2019-12-07 DIAGNOSIS — K567 Ileus, unspecified: Secondary | ICD-10-CM | POA: Diagnosis not present

## 2019-12-07 DIAGNOSIS — R109 Unspecified abdominal pain: Secondary | ICD-10-CM | POA: Diagnosis not present

## 2019-12-08 DIAGNOSIS — K567 Ileus, unspecified: Secondary | ICD-10-CM | POA: Diagnosis not present

## 2019-12-09 DIAGNOSIS — K567 Ileus, unspecified: Secondary | ICD-10-CM | POA: Diagnosis not present

## 2019-12-10 DIAGNOSIS — K567 Ileus, unspecified: Secondary | ICD-10-CM | POA: Diagnosis not present

## 2019-12-12 ENCOUNTER — Other Ambulatory Visit (HOSPITAL_COMMUNITY)
Admission: RE | Admit: 2019-12-12 | Discharge: 2019-12-12 | Disposition: A | Discharge status: Home / Self Care | Payer: Medicare Other | Source: Ambulatory Visit | Attending: Gastroenterology | Admitting: Gastroenterology

## 2019-12-12 DIAGNOSIS — Z20822 Contact with and (suspected) exposure to covid-19: Secondary | ICD-10-CM | POA: Diagnosis not present

## 2019-12-12 DIAGNOSIS — Z01812 Encounter for preprocedural laboratory examination: Secondary | ICD-10-CM | POA: Diagnosis present

## 2019-12-12 LAB — SARS CORONAVIRUS 2 (TAT 6-24 HRS): SARS Coronavirus 2: NEGATIVE

## 2019-12-12 NOTE — Progress Notes (Signed)
Pt tested today due to a transportation issue, per [employer], LPN. Per Tresa Endo she spoke with [facility] endo and this test will be accepted.

## 2019-12-13 ENCOUNTER — Other Ambulatory Visit (HOSPITAL_COMMUNITY): Payer: Medicare Other

## 2019-12-17 ENCOUNTER — Encounter (HOSPITAL_COMMUNITY)
Admission: RE | Disposition: A | Discharge status: Home / Self Care | Payer: Self-pay | Source: Home / Self Care | Attending: Gastroenterology

## 2019-12-17 ENCOUNTER — Ambulatory Visit (HOSPITAL_COMMUNITY): Payer: Medicare Other | Admitting: Anesthesiology

## 2019-12-17 ENCOUNTER — Encounter (HOSPITAL_COMMUNITY): Payer: Self-pay | Admitting: Gastroenterology

## 2019-12-17 ENCOUNTER — Other Ambulatory Visit: Payer: Self-pay | Admitting: Gastroenterology

## 2019-12-17 ENCOUNTER — Ambulatory Visit (HOSPITAL_COMMUNITY)
Admission: RE | Admit: 2019-12-17 | Discharge: 2019-12-17 | Disposition: A | Discharge status: Home / Self Care | Payer: Medicare Other | Attending: Gastroenterology | Admitting: Gastroenterology

## 2019-12-17 ENCOUNTER — Other Ambulatory Visit: Payer: Self-pay

## 2019-12-17 DIAGNOSIS — K449 Diaphragmatic hernia without obstruction or gangrene: Secondary | ICD-10-CM | POA: Diagnosis not present

## 2019-12-17 DIAGNOSIS — K219 Gastro-esophageal reflux disease without esophagitis: Secondary | ICD-10-CM

## 2019-12-17 DIAGNOSIS — Z800 Family history of malignant neoplasm of digestive organs: Secondary | ICD-10-CM | POA: Insufficient documentation

## 2019-12-17 DIAGNOSIS — G4700 Insomnia, unspecified: Secondary | ICD-10-CM | POA: Insufficient documentation

## 2019-12-17 DIAGNOSIS — K581 Irritable bowel syndrome with constipation: Secondary | ICD-10-CM | POA: Diagnosis not present

## 2019-12-17 DIAGNOSIS — Z7901 Long term (current) use of anticoagulants: Secondary | ICD-10-CM | POA: Insufficient documentation

## 2019-12-17 DIAGNOSIS — F329 Major depressive disorder, single episode, unspecified: Secondary | ICD-10-CM | POA: Diagnosis not present

## 2019-12-17 DIAGNOSIS — N3281 Overactive bladder: Secondary | ICD-10-CM | POA: Diagnosis not present

## 2019-12-17 DIAGNOSIS — G808 Other cerebral palsy: Secondary | ICD-10-CM | POA: Diagnosis not present

## 2019-12-17 DIAGNOSIS — I10 Essential (primary) hypertension: Secondary | ICD-10-CM | POA: Diagnosis not present

## 2019-12-17 DIAGNOSIS — Z791 Long term (current) use of non-steroidal anti-inflammatories (NSAID): Secondary | ICD-10-CM | POA: Insufficient documentation

## 2019-12-17 DIAGNOSIS — K317 Polyp of stomach and duodenum: Secondary | ICD-10-CM | POA: Diagnosis not present

## 2019-12-17 DIAGNOSIS — R1310 Dysphagia, unspecified: Secondary | ICD-10-CM | POA: Diagnosis not present

## 2019-12-17 DIAGNOSIS — Z955 Presence of coronary angioplasty implant and graft: Secondary | ICD-10-CM | POA: Insufficient documentation

## 2019-12-17 DIAGNOSIS — G43909 Migraine, unspecified, not intractable, without status migrainosus: Secondary | ICD-10-CM | POA: Insufficient documentation

## 2019-12-17 DIAGNOSIS — Z793 Long term (current) use of hormonal contraceptives: Secondary | ICD-10-CM | POA: Insufficient documentation

## 2019-12-17 DIAGNOSIS — M419 Scoliosis, unspecified: Secondary | ICD-10-CM | POA: Insufficient documentation

## 2019-12-17 DIAGNOSIS — K5900 Constipation, unspecified: Secondary | ICD-10-CM

## 2019-12-17 DIAGNOSIS — Z8673 Personal history of transient ischemic attack (TIA), and cerebral infarction without residual deficits: Secondary | ICD-10-CM | POA: Insufficient documentation

## 2019-12-17 DIAGNOSIS — Z880 Allergy status to penicillin: Secondary | ICD-10-CM | POA: Insufficient documentation

## 2019-12-17 DIAGNOSIS — Z85820 Personal history of malignant melanoma of skin: Secondary | ICD-10-CM | POA: Insufficient documentation

## 2019-12-17 DIAGNOSIS — G8190 Hemiplegia, unspecified affecting unspecified side: Secondary | ICD-10-CM | POA: Insufficient documentation

## 2019-12-17 DIAGNOSIS — F419 Anxiety disorder, unspecified: Secondary | ICD-10-CM | POA: Insufficient documentation

## 2019-12-17 DIAGNOSIS — K279 Peptic ulcer, site unspecified, unspecified as acute or chronic, without hemorrhage or perforation: Secondary | ICD-10-CM | POA: Insufficient documentation

## 2019-12-17 DIAGNOSIS — Z79899 Other long term (current) drug therapy: Secondary | ICD-10-CM | POA: Insufficient documentation

## 2019-12-17 DIAGNOSIS — R0602 Shortness of breath: Secondary | ICD-10-CM | POA: Insufficient documentation

## 2019-12-17 DIAGNOSIS — K2100 Gastro-esophageal reflux disease with esophagitis, without bleeding: Secondary | ICD-10-CM | POA: Diagnosis present

## 2019-12-17 DIAGNOSIS — Z808 Family history of malignant neoplasm of other organs or systems: Secondary | ICD-10-CM | POA: Insufficient documentation

## 2019-12-17 DIAGNOSIS — Z832 Family history of diseases of the blood and blood-forming organs and certain disorders involving the immune mechanism: Secondary | ICD-10-CM | POA: Insufficient documentation

## 2019-12-17 DIAGNOSIS — R1013 Epigastric pain: Secondary | ICD-10-CM

## 2019-12-17 DIAGNOSIS — Z882 Allergy status to sulfonamides status: Secondary | ICD-10-CM | POA: Diagnosis not present

## 2019-12-17 DIAGNOSIS — Z8249 Family history of ischemic heart disease and other diseases of the circulatory system: Secondary | ICD-10-CM | POA: Insufficient documentation

## 2019-12-17 DIAGNOSIS — Z801 Family history of malignant neoplasm of trachea, bronchus and lung: Secondary | ICD-10-CM | POA: Insufficient documentation

## 2019-12-17 HISTORY — PX: ESOPHAGOGASTRODUODENOSCOPY (EGD) WITH PROPOFOL: SHX5813

## 2019-12-17 HISTORY — PX: MALONEY DILATION: SHX5535

## 2019-12-17 HISTORY — PX: BIOPSY: SHX5522

## 2019-12-17 SURGERY — ESOPHAGOGASTRODUODENOSCOPY (EGD) WITH PROPOFOL
Anesthesia: Monitor Anesthesia Care

## 2019-12-17 MED ORDER — LACTATED RINGERS IV SOLN
INTRAVENOUS | Status: AC | PRN
  Administered 2019-12-17: 1000 mL via INTRAVENOUS

## 2019-12-17 MED ORDER — CLENPIQ 10-3.5-12 MG-GM -GM/160ML PO SOLN: 1.0000 | Freq: Once | ORAL | 0 refills | Status: AC

## 2019-12-17 MED ORDER — PROPOFOL 10 MG/ML IV BOLUS
INTRAVENOUS | Status: AC
  Filled 2019-12-17: qty 20

## 2019-12-17 MED ORDER — LIDOCAINE 2% (20 MG/ML) 5 ML SYRINGE
INTRAMUSCULAR | Status: DC | PRN
  Administered 2019-12-17: 60 mg via INTRAVENOUS

## 2019-12-17 MED ORDER — PROPOFOL 500 MG/50ML IV EMUL
INTRAVENOUS | Status: DC | PRN
  Administered 2019-12-17: 10 mg via INTRAVENOUS
  Administered 2019-12-17 (×2): 20 mg via INTRAVENOUS

## 2019-12-17 MED ORDER — ONDANSETRON HCL 4 MG/2ML IJ SOLN
INTRAMUSCULAR | Status: AC
  Filled 2019-12-17: qty 2

## 2019-12-17 MED ORDER — ONDANSETRON HCL 4 MG PO TABS: ORAL_TABLET | ORAL | 0 refills | Status: AC

## 2019-12-17 MED ORDER — ONDANSETRON HCL 4 MG/2ML IJ SOLN
4.0000 mg | Freq: Once | INTRAMUSCULAR | Status: AC
  Administered 2019-12-17: 4 mg via INTRAVENOUS

## 2019-12-17 MED ORDER — SODIUM CHLORIDE 0.9 % IV SOLN: INTRAVENOUS | Status: DC

## 2019-12-17 MED ORDER — PROPOFOL 500 MG/50ML IV EMUL
INTRAVENOUS | Status: DC | PRN
  Administered 2019-12-17: 125 ug/kg/min via INTRAVENOUS

## 2019-12-17 SURGICAL SUPPLY — 25 items

## 2019-12-17 NOTE — Discharge Instructions (Signed)
YOU HAD AN ENDOSCOPIC PROCEDURE TODAY: Refer to the procedure report and other information in the discharge instructions given to you for any specific questions about what was found during the examination. If this information does not answer your questions, please call [place] office at [phone] to clarify.  ° °YOU SHOULD EXPECT: Some feelings of bloating in the abdomen. Passage of more gas than usual. Walking can help get rid of the air that was put into your GI tract during the procedure and reduce the bloating. If you had a lower endoscopy (such as a colonoscopy or flexible sigmoidoscopy) you may notice spotting of blood in your stool or on the toilet paper. Some abdominal soreness may be present for a day or two, also. ° °DIET: Your first meal following the procedure should be a light meal and then it is ok to progress to your normal diet. A half-sandwich or bowl of soup is an example of a good first meal. Heavy or fried foods are harder to digest and may make you feel nauseous or bloated. Drink plenty of fluids but you should avoid alcoholic beverages for 24 hours. If you had a esophageal dilation, please see attached instructions for diet.   ° °ACTIVITY: Your care partner should take you home directly after the procedure. You should plan to take it easy, moving slowly for the rest of the day. You can resume normal activity the day after the procedure however YOU SHOULD NOT DRIVE, use power tools, machinery or perform tasks that involve climbing or major physical exertion for 24 hours (because of the sedation medicines used during the test).  ° °SYMPTOMS TO REPORT IMMEDIATELY: °A gastroenterologist can be reached at any hour. Please call [phone]  for any of the following symptoms:  °Following lower endoscopy (colonoscopy, flexible sigmoidoscopy) °Excessive amounts of blood in the stool  °Significant tenderness, worsening of abdominal pains  °Swelling of the abdomen that is new, acute  °Fever of 100° or  higher  °Following upper endoscopy (EGD, EUS, ERCP, esophageal dilation) °Vomiting of blood or coffee ground material  °New, significant abdominal pain  °New, significant chest pain or pain under the shoulder blades  °Painful or persistently difficult swallowing  °New shortness of breath  °Black, tarry-looking or red, bloody stools ° °FOLLOW UP:  °If any biopsies were taken you will be contacted by phone or by letter within the next 1-3 weeks. Call [phone]  if you have not heard about the biopsies in 3 weeks.  °Please also call with any specific questions about appointments or follow up tests. ° °

## 2019-12-17 NOTE — Transfer of Care (Signed)
Immediate Anesthesia Transfer of Care Note  Patient: Amanda Clay  Procedure(s) Performed: Procedure(s) with comments: ESOPHAGOGASTRODUODENOSCOPY (EGD) WITH PROPOFOL (N/A) - EGD with dilation MALONEY DILATION  Patient Location: PACU  Anesthesia Type:MAC  Level of Consciousness:  sedated, patient cooperative and responds to stimulation  Airway & Oxygen Therapy:Patient Spontanous Breathing and Patient connected to face mask oxgen  Post-op Assessment:  Report given to PACU RN and Post -op Vital signs reviewed and stable  Post vital signs:  Reviewed and stable  Last Vitals:  Vitals:   12/17/19 1052  BP: 113/87  Pulse: 97  Resp: (!) 22  Temp: 36.9 C  SpO2: 981%    Complications: No apparent anesthesia complications

## 2019-12-17 NOTE — Op Note (Signed)
[facility] Patient Name: Amanda McardleMiranda Kruger Procedure Date: 12/17/2019 MRN: [id number] Attending MD: Doristine LocksVito Ashten Sarnowski , MD Date of Birth: Jan 22, 1981 CSN: [id number] Age: 39 Admit Type: Outpatient Procedure:                Upper GI endoscopy Indications:              Epigastric abdominal pain, Dysphagia, Esophageal                            reflux Providers:                Doristine LocksVito Dierre Crevier, MD, Tillie Fantasiaonna Pickering, RN, Adolph PollackElizabeth                            Mathai RN,, Kandice RobinsonsGuillaume Awaka, Technician Referring MD:              Medicines:                Monitored Anesthesia Care Complications:            No immediate complications. Estimated Blood Loss:     Estimated blood loss was minimal. Procedure:                Pre-Anesthesia Assessment:                           - Prior to the procedure, a History and Physical                            was performed, and patient medications and                            allergies were reviewed. The patient's tolerance of                            previous anesthesia was also reviewed. The risks                            and benefits of the procedure and the sedation                            options and risks were discussed with the patient.                            All questions were answered, and informed consent                            was obtained. Prior Anticoagulants: The patient has                            taken Xarelto (rivaroxaban), last dose was 2 days                            prior to procedure. ASA Grade Assessment: III - A  patient with severe systemic disease. After                            reviewing the risks and benefits, the patient was                            deemed in satisfactory condition to undergo the                            procedure.                           After obtaining informed consent, the endoscope was                            passed under direct vision.  Throughout the                            procedure, the patient's blood pressure, pulse, and                            oxygen saturations were monitored continuously. The                            GIF-H190 ([id number]) Olympus gastroscope was                            introduced through the mouth, and advanced to the                            third part of duodenum. The upper GI endoscopy was                            accomplished without difficulty. The patient                            tolerated the procedure well. Scope In: Scope Out: Findings:      The examined esophagus was normal. The scope was withdrawn. Dilation was       performed with a Maloney dilator with no resistance at 54 Fr. The       dilation site was examined following endoscope reinsertion and showed no       bleeding, mucosal tear or perforation. Estimated blood loss: none.      The Z-line was regular and was found 35 cm from the incisors.      The gastroesophageal flap valve was visualized endoscopically and       classified as Hill Grade IV (no fold, wide open lumen, hiatal hernia       present).      Multiple 2 to 6 mm sessile polyps with no bleeding and no stigmata of       recent bleeding were found in the gastric fundus and in the gastric       body. Several of these polyps were removed with a cold biopsy forceps       for histologic representative evaluation along with evaluation for  Helicobacter pylori. Resection and retrieval were complete. Estimated       blood loss was minimal.      A medium amount of food (residue) and liquid was found in the gastric       fundus and in the gastric body. The liquid was removed with fluid       aspiration. Some of the food residue was adherent to the gastric       surface, but the majority of the mucosal surface was visible and able to       be adequately examined.      The incisura, gastric antrum and pylorus were normal.      The duodenal bulb, first portion  of the duodenum, second portion of the       duodenum and third portion of the duodenum were normal. Impression:               - Normal esophagus. Dilated.                           - Z-line regular, 35 cm from the incisors.                           - Gastroesophageal flap valve classified as Hill                            Grade IV (no fold, wide open lumen, hiatal hernia                            present).                           - Multiple gastric polyps. Resected and retrieved.                           - A medium amount of food (residue) in the stomach.                           - Normal incisura, antrum and pylorus.                           - Normal duodenal bulb, first portion of the                            duodenum, second portion of the duodenum and third                            portion of the duodenum.                           - Retained fluid/food in the stomach suggestive of                            impaired gastric motility. Depending on presence of                            ongoing symptoms, may benefit from Gastric Emptying  Study. Moderate Sedation:      Not Applicable - Patient had care per Anesthesia. Recommendation:           - Patient has a contact number available for                            emergencies. The signs and symptoms of potential                            delayed complications were discussed with the                            patient. Return to normal activities tomorrow.                            Written discharge instructions were provided to the                            patient.                           - Resume previous diet today.                           - Continue present medications.                           - Await pathology results.                           - Had planned for colonoscopy today as well.                            However, this was aborted due to poor bowel                             preparation. Will attempt to secure endoscopy slot                            in 2 days. If able to do so, plan for clear liquids                            until then with repeat prep using Dulcolax 20 mg                            BID and full bowel preparation along with Zofran                            prn. Otherwise, will coordinate for colonoscopy                            with plan for extended bowel preparation and                            antiemetics. Procedure Code(s):        ---  Professional ---                           [phone], Esophagogastroduodenoscopy, flexible,                            transoral; with biopsy, single or multiple                           43450, Dilation of esophagus, by unguided sound or                            bougie, single or multiple passes Diagnosis Code(s):        --- Professional ---                           K44.9, Diaphragmatic hernia without obstruction or                            gangrene                           K31.7, Polyp of stomach and duodenum                           R10.13, Epigastric pain                           R13.10, Dysphagia, unspecified                           K21.9, Gastro-esophageal reflux disease without                            esophagitis CPT copyright 2019 American Medical Association. All rights reserved. The codes documented in this report are preliminary and upon coder review may  be revised to meet current compliance requirements. Gerrit Heck, MD 12/17/2019 11:55:11 AM Number of Addenda: 0

## 2019-12-17 NOTE — Anesthesia Preprocedure Evaluation (Signed)
Anesthesia Evaluation  Patient identified by MRN, date of birth, ID band Patient awake    Reviewed: Allergy & Precautions, H&P , NPO status , Patient's Chart, lab work & pertinent test results, reviewed documented beta blocker date and time   History of Anesthesia Complications (+) PONV  Airway Mallampati: II  TM Distance: >3 FB Neck ROM: full    Dental no notable dental hx. (+) Teeth Intact   Pulmonary neg pulmonary ROS, shortness of breath,    Pulmonary exam normal breath sounds clear to auscultation       Cardiovascular Exercise Tolerance: Good hypertension,  Rhythm:regular Rate:Normal     Neuro/Psych  Headaches, Seizures -,  PSYCHIATRIC DISORDERS Anxiety Depression  Neuromuscular disease CVA    GI/Hepatic negative GI ROS, Neg liver ROS, GERD  ,  Endo/Other  negative endocrine ROSdiabetes  Renal/GU Renal disease     Musculoskeletal   Abdominal   Peds  Hematology negative hematology ROS (+)   Anesthesia Other Findings   Reproductive/Obstetrics negative OB ROS                             Anesthesia Physical  Anesthesia Plan  ASA: III  Anesthesia Plan: MAC   Post-op Pain Management:    Induction:   PONV Risk Score and Plan: Treatment may vary due to age or medical condition, TIVA and Propofol infusion  Airway Management Planned:   Additional Equipment:   Intra-op Plan:   Post-operative Plan:   Informed Consent: I have reviewed the patients History and Physical, chart, labs and discussed the procedure including the risks, benefits and alternatives for the proposed anesthesia with the patient or authorized representative who has indicated his/her understanding and acceptance.     Dental advisory given  Plan Discussed with: CRNA  Anesthesia Plan Comments:         Anesthesia Quick Evaluation

## 2019-12-17 NOTE — Anesthesia Postprocedure Evaluation (Signed)
Anesthesia Post Note  Patient: Amanda Clay  Procedure(s) Performed: ESOPHAGOGASTRODUODENOSCOPY (EGD) WITH PROPOFOL (N/A ) [place]irie DILATION     Patient location during evaluation: PACU Anesthesia Type: MAC Level of consciousness: awake and alert Pain management: pain level controlled Vital Signs Assessment: post-procedure vital signs reviewed and stable Respiratory status: spontaneous breathing Cardiovascular status: stable Anesthetic complications: no    Last Vitals:  Vitals:   12/17/19 1150 12/17/19 1205  BP: (!) 107/31 90/60  Pulse: 87 88  Resp: 14 14  Temp:    SpO2: 100% 100%    Last Pain:  Vitals:   12/17/19 1205  TempSrc:   PainSc: 0-No pain                 Nolon Nations

## 2019-12-17 NOTE — Interval H&P Note (Signed)
History and Physical Interval Note:  12/17/2019 11:02 AM  Amanda Clay  has presented today for surgery, with the diagnosis of dysphagia gerd constipation MEG pain.  The various methods of treatment have been discussed with the patient and family. After consideration of risks, benefits and other options for treatment, the patient has consented to  Procedure(s) with comments: COLONOSCOPY WITH PROPOFOL (N/A) ESOPHAGOGASTRODUODENOSCOPY (EGD) WITH PROPOFOL (N/A) - EGD with dilation as a surgical intervention.  The patient's history has been reviewed, patient examined, no change in status, stable for surgery.  I have reviewed the patient's chart and labs.  Questions were answered to the patient's satisfaction.     Verlin Dike Sulay Brymer

## 2019-12-17 NOTE — Progress Notes (Signed)
This RN spoke with Nurse Burna Mortimer at [facility] assisted living about this pts procedure and care after instructions. Pt left with Crystal to go back to [facility].

## 2019-12-18 ENCOUNTER — Encounter: Payer: Self-pay | Admitting: *Deleted

## 2019-12-18 LAB — SURGICAL PATHOLOGY

## 2019-12-19 ENCOUNTER — Encounter: Payer: Self-pay | Admitting: Gastroenterology

## 2019-12-24 ENCOUNTER — Telehealth: Payer: Self-pay | Admitting: Gastroenterology

## 2019-12-24 NOTE — Telephone Encounter (Signed)
I have called and left message for patient to return my call.  

## 2019-12-25 NOTE — Telephone Encounter (Signed)
I have called and left a message for a return call.

## 2019-12-26 NOTE — Telephone Encounter (Signed)
Message left with Amanda Clay  that she can come by the office to pock up a sample prep of Clinpiq on 01/14/20 after her covid test.

## 2020-01-08 NOTE — Progress Notes (Signed)
Requested current [facility] and medical history from [facility].

## 2020-01-09 NOTE — Progress Notes (Addendum)
Preop instructions for:   Amanda Clay                       Date of Birth  : 1980/10/26                          Date of Procedure:  01/16/2020     Doctor: DR Barron Alvine  Time to arrive at [facility]: [id number] Report to: Admitting  Procedure: Colonoscopy    Do not eat or drink past midnight the night before your procedure.(To include any tube feedings-must be discontinued)    Take these morning medications only with sips of water.(or give through gastrostomy or feeding tube).  Dexilant if takes in am  Myrbetriq if takes in am  Sertraline if takes in am  Clonazepam if takes in am  Topiramate Buspirone  Gabapentin  Tolterodine if takes in am   Preprocedure instructions regarding Xarelto to come from MD.  Preprocedure prep instructions to come from MD.  Facility contact: [facility] [place]                  Phone: [phone]                 Health Care POA:  Transportation contact phone#:  Please send day of procedure:current med list and meds last taken that day, confirm nothing by mouth status from what time, Patient Demographic info( to include DNR status, problem list, allergies)   RN contact name/phone#:                             and Fax #[phone]  [employer] card and picture ID Leave all jewelry and other valuables at place where living( no metal or rings to be worn) No contact lens Women-no make-up, no lotions,perfumes,powders   Any questions day of procedure,call  Endoscopy - [phone]   Sent from :[facility]                   Phone:[phone]                   Fax:[phone]  Sent by :  Cyndia Diver RN

## 2020-01-09 NOTE — Progress Notes (Signed)
Preprocedure instructions faxed to [facility] [place].

## 2020-01-14 ENCOUNTER — Other Ambulatory Visit (HOSPITAL_COMMUNITY)
Admission: RE | Admit: 2020-01-14 | Discharge: 2020-01-14 | Disposition: A | Discharge status: Home / Self Care | Payer: Medicare Other | Source: Ambulatory Visit | Attending: Gastroenterology | Admitting: Gastroenterology

## 2020-01-14 DIAGNOSIS — Z01812 Encounter for preprocedural laboratory examination: Secondary | ICD-10-CM | POA: Diagnosis present

## 2020-01-14 DIAGNOSIS — Z20822 Contact with and (suspected) exposure to covid-19: Secondary | ICD-10-CM | POA: Insufficient documentation

## 2020-01-14 LAB — SARS CORONAVIRUS 2 (TAT 6-24 HRS): SARS Coronavirus 2: NEGATIVE

## 2020-01-16 ENCOUNTER — Ambulatory Visit (HOSPITAL_COMMUNITY): Payer: Medicare Other | Admitting: Anesthesiology

## 2020-01-16 ENCOUNTER — Other Ambulatory Visit: Payer: Self-pay

## 2020-01-16 ENCOUNTER — Encounter (HOSPITAL_COMMUNITY)
Admission: RE | Disposition: A | Discharge status: Skilled Nursing Facility | Payer: Self-pay | Source: Home / Self Care | Attending: Gastroenterology

## 2020-01-16 ENCOUNTER — Encounter (HOSPITAL_COMMUNITY): Payer: Self-pay | Admitting: Gastroenterology

## 2020-01-16 ENCOUNTER — Ambulatory Visit (HOSPITAL_COMMUNITY)
Admission: RE | Admit: 2020-01-16 | Discharge: 2020-01-16 | Disposition: A | Discharge status: Skilled Nursing Facility | Payer: Medicare Other | Attending: Gastroenterology | Admitting: Gastroenterology

## 2020-01-16 DIAGNOSIS — Z8673 Personal history of transient ischemic attack (TIA), and cerebral infarction without residual deficits: Secondary | ICD-10-CM | POA: Diagnosis not present

## 2020-01-16 DIAGNOSIS — Q438 Other specified congenital malformations of intestine: Secondary | ICD-10-CM

## 2020-01-16 DIAGNOSIS — K922 Gastrointestinal hemorrhage, unspecified: Secondary | ICD-10-CM | POA: Insufficient documentation

## 2020-01-16 DIAGNOSIS — Z79899 Other long term (current) drug therapy: Secondary | ICD-10-CM | POA: Diagnosis not present

## 2020-01-16 DIAGNOSIS — R1310 Dysphagia, unspecified: Secondary | ICD-10-CM | POA: Insufficient documentation

## 2020-01-16 DIAGNOSIS — K5900 Constipation, unspecified: Secondary | ICD-10-CM | POA: Diagnosis not present

## 2020-01-16 DIAGNOSIS — K219 Gastro-esophageal reflux disease without esophagitis: Secondary | ICD-10-CM | POA: Diagnosis not present

## 2020-01-16 DIAGNOSIS — I10 Essential (primary) hypertension: Secondary | ICD-10-CM | POA: Insufficient documentation

## 2020-01-16 DIAGNOSIS — G808 Other cerebral palsy: Secondary | ICD-10-CM | POA: Insufficient documentation

## 2020-01-16 HISTORY — PX: COLONOSCOPY WITH PROPOFOL: SHX5780

## 2020-01-16 HISTORY — PX: HEMOSTASIS CLIP PLACEMENT: SHX6857

## 2020-01-16 SURGERY — COLONOSCOPY WITH PROPOFOL
Anesthesia: Monitor Anesthesia Care

## 2020-01-16 MED ORDER — LACTATED RINGERS IV SOLN: INTRAVENOUS | Status: DC

## 2020-01-16 MED ORDER — PROPOFOL 500 MG/50ML IV EMUL
INTRAVENOUS | Status: AC
  Filled 2020-01-16: qty 50

## 2020-01-16 MED ORDER — PROPOFOL 500 MG/50ML IV EMUL
INTRAVENOUS | Status: DC | PRN
  Administered 2020-01-16: 20 mg via INTRAVENOUS
  Administered 2020-01-16 (×3): 10 mg via INTRAVENOUS

## 2020-01-16 MED ORDER — SODIUM CHLORIDE 0.9 % IV SOLN: INTRAVENOUS | Status: DC

## 2020-01-16 MED ORDER — PROPOFOL 500 MG/50ML IV EMUL
INTRAVENOUS | Status: DC | PRN
  Administered 2020-01-16: 125 ug/kg/min via INTRAVENOUS

## 2020-01-16 MED ORDER — LIDOCAINE HCL (CARDIAC) PF 100 MG/5ML IV SOSY
PREFILLED_SYRINGE | INTRAVENOUS | Status: DC | PRN
  Administered 2020-01-16: 50 mg via INTRATRACHEAL

## 2020-01-16 SURGICAL SUPPLY — 24 items

## 2020-01-16 NOTE — Transfer of Care (Signed)
Immediate Anesthesia Transfer of Care Note  Patient: Amanda Clay  Procedure(s) Performed: COLONOSCOPY WITH PROPOFOL (N/A ) HEMOSTASIS CLIP PLACEMENT  Patient Location: PACU  Anesthesia Type:MAC  Level of Consciousness: drowsy, patient cooperative and responds to stimulation  Airway & Oxygen Therapy: Patient Spontanous Breathing and Patient connected to face mask oxygen  Post-op Assessment: Report given to RN and Post -op Vital signs reviewed and stable  Post vital signs: Reviewed and stable  Last Vitals:  Vitals Value Taken Time  BP    Temp    Pulse 103 01/16/20 1040  Resp 16 01/16/20 1040  SpO2 100 % 01/16/20 1040  Vitals shown include unvalidated device data.  Last Pain:  Vitals:   01/16/20 0915  TempSrc: Axillary  PainSc: 0-No pain         Complications: No apparent anesthesia complications

## 2020-01-16 NOTE — Discharge Instructions (Signed)
YOU HAD AN ENDOSCOPIC PROCEDURE TODAY: Refer to the procedure report and other information in the discharge instructions given to you for any specific questions about what was found during the examination. If this information does not answer your questions, please call [place] office at [phone] to clarify.  ° °YOU SHOULD EXPECT: Some feelings of bloating in the abdomen. Passage of more gas than usual. Walking can help get rid of the air that was put into your GI tract during the procedure and reduce the bloating. If you had a lower endoscopy (such as a colonoscopy or flexible sigmoidoscopy) you may notice spotting of blood in your stool or on the toilet paper. Some abdominal soreness may be present for a day or two, also. ° °DIET: Your first meal following the procedure should be a light meal and then it is ok to progress to your normal diet. A half-sandwich or bowl of soup is an example of a good first meal. Heavy or fried foods are harder to digest and may make you feel nauseous or bloated. Drink plenty of fluids but you should avoid alcoholic beverages for 24 hours. If you had a esophageal dilation, please see attached instructions for diet.   ° °ACTIVITY: Your care partner should take you home directly after the procedure. You should plan to take it easy, moving slowly for the rest of the day. You can resume normal activity the day after the procedure however YOU SHOULD NOT DRIVE, use power tools, machinery or perform tasks that involve climbing or major physical exertion for 24 hours (because of the sedation medicines used during the test).  ° °SYMPTOMS TO REPORT IMMEDIATELY: °A gastroenterologist can be reached at any hour. Please call [phone]  for any of the following symptoms:  °Following lower endoscopy (colonoscopy, flexible sigmoidoscopy) °Excessive amounts of blood in the stool  °Significant tenderness, worsening of abdominal pains  °Swelling of the abdomen that is new, acute  °Fever of 100° or  higher  °Following upper endoscopy (EGD, EUS, ERCP, esophageal dilation) °Vomiting of blood or coffee ground material  °New, significant abdominal pain  °New, significant chest pain or pain under the shoulder blades  °Painful or persistently difficult swallowing  °New shortness of breath  °Black, tarry-looking or red, bloody stools ° °FOLLOW UP:  °If any biopsies were taken you will be contacted by phone or by letter within the next 1-3 weeks. Call [phone]  if you have not heard about the biopsies in 3 weeks.  °Please also call with any specific questions about appointments or follow up tests. ° °

## 2020-01-16 NOTE — H&P (Signed)
Chief Complaint: Chronic constipation  GI history: Amanda Clay is a very pleasant 39 y.o. female with a history of cerebral palsy, major depressive disorder, hemiplegia, GERD, scoliosis, history of seizures, hypertension, overactive bladder, referred to the Gastroenterology Clinic from [place] for evaluation of constipation, initially seen on 11/19/2019.  Records received from [facility] with the following information: -Note dated 08/26/2019-gaseous distention. Evaluating with KUB to review. Maintaining clear liquid diet and GI referral. -KUB (08/26/2019): Demonstrates gaseous distention of the loops of bowels and rectosigmoid, with minimal change from KUB on 08/24/2019. No air-fluid levels, no pneumoperitoneum. -KUB performed 08/31/2019: No report for review -KUB (09/03/2019): Stable mild adynamic ileus. No pneumoperitoneum. No change from study dated 08/31/2019 (that report not attached for review) -KUB (10/04/2019): Viewable on medical aid's phone today with stable adynamic ileus -MAR reviewed  She states she has had chronic constipation for a long time, but worsening lately.  Multiple medications to include MiraLAX, Colace, lactulose, Senokot Gas-X.  Currently taking MiraLAX daily, lactulose daily, Senexon bid.  Still have considerable straining to have BM. Will push on her abdomen in order to facilitate BM.  Consumes adequate amount of liquids.  No previous colonoscopy, but reports this was previously recommended and scheduled by a prior GI. -We will schedule for colonoscopy on 12/17/2019, but aborted due to poor bowel prep  History of GERD, currently prescribed Dexilant 60 mg/day, which controls reflux symptoms (heartburn, regurgitation).    Also with solid food dysphagia, pointing to lower sternum/xiphoid process, occurring with essentially every meal.  No liquid dysphagia.  No history of food impactions.  EGD years ago diagnosed with  GERD PUD (no report for review).  Prescribed Reglan 5 mg before each meal, presumably to aid in reflux management.  Otherwise, no nausea/vomiting. -EGD (12/17/2019, Dr. Bryan Lemma): Normal esophagus, empiric dilation with 34 Fr Maloney, regular Z-line, Hill grade 4 valve, benign gastric polyps, retained food residue.  Consider GES  Prescribed multiple medications, multiple of which w/ ADR constipation: oxybutynin (9-15%), tolterodine (7%),Topamax (1-4%), Neurontin (1-4%).  TTE completed 07/2019: EF 60-65%.  Zio patch monitor completed 08/2019.    HPI:     Amanda Clay is a 39 y.o. female presenting to [facility] unit for colonoscopy today for evaluation of chronic, progressive constipation as above.  She was able to complete extended bowel prep.  Past Medical History:  Diagnosis Date  . Acute cystitis without hematuria   . Anxiety   . Cerebral palsy ([place])   . Constipation   . Endometriosis   . Essential hypertension   . GAD (generalized anxiety disorder)   . GERD (gastroesophageal reflux disease)   . Hemiplegia and hemiparesis   . Hypertension   . IBS (irritable bowel syndrome)   . Insomnia   . Major depressive disorder   . Migraine   . Muscle weakness (generalized)   . Neuropathy    poly  . Overactive bladder   . Panic attacks   . Polyneuropathy   . Scoliosis   . Seizures ([place])    x 4 unknown etiology  last 3 years ago  . Shortness of breath dyspnea    when on back,scoliosis  . Stroke ([place])    12/2012  . Urge incontinence   . Urinary retention   . Vitamin deficiency    unspecified  . Yeast vaginitis      Past Surgical History:  Procedure Laterality Date  . abducter surgery  2000  . BIOPSY  12/17/2019  Procedure: BIOPSY;  Surgeon: Shellia Cleverly, DO;  Location: [facility];  Service: Gastroenterology;;  . CORONARY ANGIOPLASTY  2016  . ESOPHAGOGASTRODUODENOSCOPY     more than 10 years ago  . ESOPHAGOGASTRODUODENOSCOPY (EGD) WITH PROPOFOL  N/A 12/17/2019   Procedure: ESOPHAGOGASTRODUODENOSCOPY (EGD) WITH PROPOFOL;  Surgeon: Shellia Cleverly, DO;  Location: [facility];  Service: Gastroenterology;  Laterality: N/A;  EGD with dilation  . eye surgeries  1993  . HYSTEROSCOPY WITH D & C  08/22/2015   Procedure: DILATATION AND CURETTAGE /HYSTEROSCOPY;  Surgeon: Ala Dach, MD;  Location: [facility] ORS;  Service: Gynecology;;  . INTRAUTERINE DEVICE (IUD) INSERTION N/A 08/22/2015   Procedure: INTRAUTERINE DEVICE (IUD) INSERTION;  Surgeon: Ala Dach, MD;  Location: [facility] ORS;  Service: Gynecology;  Laterality: N/A;  . leg surgeries  2008  . MALONEY DILATION  12/17/2019   Procedure: MALONEY DILATION;  Surgeon: Shellia Cleverly, DO;  Location: [facility];  Service: Gastroenterology;;  . MELANOMA EXCISION  2001  . PERIPHERAL VASCULAR CATHETERIZATION N/A 04/02/2015   Procedure: PICC Line Insertion;  Surgeon: Renford Dills, MD;  Location: [facility] INVASIVE CV LAB;  Service: Cardiovascular;  Laterality: N/A;   Family History  Problem Relation Age of Onset  . Bone cancer Father   . Lung cancer Father   . Heart disease Father        quadable by pass  . Clotting disorder Paternal Grandmother   . Colon cancer Cousin   . Bladder Cancer Neg Hx   . Kidney disease Neg Hx   . Prostate cancer Neg Hx    Social History   Tobacco Use  . Smoking status: Never Smoker  . Smokeless tobacco: Never Used  Substance Use Topics  . Alcohol use: No  . Drug use: No   No current facility-administered medications for this encounter.   Allergies  Allergen Reactions  . Penicillin G Swelling  . Sulfa Antibiotics Hives, Itching and Swelling     Review of Systems: All systems reviewed and negative except where noted in HPI.     Physical Exam:    Wt Readings from Last 3 Encounters:  01/16/20 52.2 kg  12/17/19 54.4 kg  11/19/19 49.9 kg    BP 108/84   Pulse 89   Temp 98.6 F (37 C) (Axillary)   Resp (!) 22   Ht 4\' 6"  (1.372 m)    Wt 52.2 kg   SpO2 98%   BMI 27.73 kg/m  Constitutional:  Pleasant, in no acute distress. Psychiatric: Normal mood and affect. Behavior is normal. EENT: Pupils normal.  Conjunctivae are normal. No scleral icterus. Neck supple. No cervical LAD. Cardiovascular: Normal rate, regular rhythm. No edema Pulmonary/chest: Effort normal and breath sounds normal. No wheezing, rales or rhonchi. Abdominal: Soft, nondistended, nontender. Bowel sounds active throughout. There are no masses palpable. No hepatomegaly. Neurological: Alert and oriented to person place and time. Skin: Skin is warm and dry. No rashes noted.   ASSESSMENT AND PLAN;   1) Chronic constipation: -Colonoscopy today to evaluate for luminal/mucosal pathology -Colonoscopy unrevealing, likely plan for ARM to rule out pelvic floor dyssynergia -Retained food in stomach during previous EGD.  Suspicious for decreased GI motility and may benefit from GES along with sitz marker study  Discussed the risks, benefits, alternatives of colonoscopy to include bleeding, perforation, infection, cardiopulmonary issues, and she wishes to proceed.   Alain Deschene, DO, FACG  01/16/2020, 9:29 AM   No ref. provider found

## 2020-01-16 NOTE — Anesthesia Preprocedure Evaluation (Addendum)
Anesthesia Evaluation  Patient identified by MRN, date of birth, ID band Patient awake    Reviewed: Allergy & Precautions, NPO status , Patient's Chart, lab work & pertinent test results  Airway Mallampati: II  TM Distance: >3 FB Neck ROM: Full    Dental no notable dental hx. (+) Poor Dentition, Missing   Pulmonary neg pulmonary ROS,    Pulmonary exam normal breath sounds clear to auscultation       Cardiovascular hypertension, Normal cardiovascular exam Rhythm:Regular Rate:Normal     Neuro/Psych  Headaches, Seizures -, Well Controlled,  PSYCHIATRIC DISORDERS Anxiety Depression Cerebral palsy  Neuromuscular disease CVA    GI/Hepatic Neg liver ROS, GERD  Medicated,  Endo/Other  negative endocrine ROS  Renal/GU negative Renal ROS     Musculoskeletal CP   Abdominal   Peds  Hematology negative hematology ROS (+)   Anesthesia Other Findings Constipation dysphagia  Reproductive/Obstetrics                             Anesthesia Physical Anesthesia Plan  ASA: III  Anesthesia Plan: MAC   Post-op Pain Management:    Induction: Intravenous  PONV Risk Score and Plan: 2 and Propofol infusion and Treatment may vary due to age or medical condition  Airway Management Planned: Simple Face Mask  Additional Equipment:   Intra-op Plan:   Post-operative Plan:   Informed Consent: I have reviewed the patients History and Physical, chart, labs and discussed the procedure including the risks, benefits and alternatives for the proposed anesthesia with the patient or authorized representative who has indicated his/her understanding and acceptance.     Dental advisory given  Plan Discussed with: CRNA  Anesthesia Plan Comments: (Pregnancy test offered to and declined by patient )        Anesthesia Quick Evaluation

## 2020-01-16 NOTE — Interval H&P Note (Signed)
History and Physical Interval Note:  01/16/2020 9:40 AM  Amanda Clay  has presented today for surgery, with the diagnosis of constipation dysphagia.  The various methods of treatment have been discussed with the patient and family. After consideration of risks, benefits and other options for treatment, the patient has consented to  Procedure(s): COLONOSCOPY WITH PROPOFOL (N/A) as a surgical intervention.  The patient's history has been reviewed, patient examined, no change in status, stable for surgery.  I have reviewed the patient's chart and labs.  Questions were answered to the patient's satisfaction.     Verlin Dike [name]

## 2020-01-16 NOTE — Anesthesia Procedure Notes (Signed)
Date/Time: 01/16/2020 9:40 AM Performed by: Thornell Mule, CRNA Oxygen Delivery Method: Simple face mask

## 2020-01-16 NOTE — Op Note (Addendum)
[facility] Patient Name: Amanda Clay Procedure Date: 01/16/2020 MRN: [id number] Attending MD: Doristine Locks , MD Date of Birth: May 23, 1981 CSN: [id number] Age: 39 Admit Type: Outpatient Procedure:                Colonoscopy Indications:              Constipation Providers:                Doristine Locks, MD, Dwain Sarna, RN, Marc Morgans, Technician Referring MD:              Medicines:                Monitored Anesthesia Care Complications:            No immediate complications. Estimated Blood Loss:     Estimated blood loss was minimal. Procedure:                Pre-Anesthesia Assessment:                           - Prior to the procedure, a History and Physical                            was performed, and patient medications and                            allergies were reviewed. The patient's tolerance of                            previous anesthesia was also reviewed. The risks                            and benefits of the procedure and the sedation                            options and risks were discussed with the patient.                            All questions were answered, and informed consent                            was obtained. Prior Anticoagulants: The patient has                            taken Xarelto (rivaroxaban), last dose was 2 days                            prior to procedure. ASA Grade Assessment: III - A                            patient with severe systemic disease. After                            reviewing the  risks and benefits, the patient was                            deemed in satisfactory condition to undergo the                            procedure.                           After obtaining informed consent, the colonoscope                            was passed under direct vision. Throughout the                            procedure, the patient's blood pressure, pulse, and             oxygen saturations were monitored continuously. The                            PCF-H190DL ([id number] Olympus pediatric colonscope                            was introduced through the anus and advanced to the                            the terminal ileum. The colonoscopy was technically                            difficult and complex due to significant looping                            and a tortuous colon. Successful completion of the                            procedure was aided by applying abdominal pressure.                            The patient tolerated the procedure well. The                            quality of the bowel preparation was adequate. The                            terminal ileum, ileocecal valve, appendiceal                            orifice, and rectum were photographed. Scope In: 9:52:41 AM Scope Out: 10:30:19 AM Scope Withdrawal Time: 0 hours 21 minutes 36 seconds  Total Procedure Duration: 0 hours 37 minutes 38 seconds  Findings:      The perianal and digital rectal examinations were normal.      The sigmoid colon was significantly tortuous and with significant       looping. External abdominal pressure applied to faciliatate advancement  of the colonoscope. On withdrawal, there was a small area of localized       mucosal trauma located 40-45 cm from the anal verge. There was a small       area of persistent mucosal oozing. For hemostasis, two hemostatic clips       were successfully placed. There was no bleeding at the end of the       procedure. No issues with insufflation and the air was then removed.      The exam was otherwise normal throughout the remainder of the colon.      The terminal ileum appeared normal.      The retroflexed view of the distal rectum and anal verge was normal and       showed no anal or rectal abnormalities. Impression:               - Tortuous sigmoid colon. There was a localized                            area  of mild mucosal trauma, with a single area of                            peristent mucosal oozing that was successfully                            treated with clips x2.                           - The examined portion of the ileum was normal.                           - No specimens collected. Moderate Sedation:      Not Applicable - Patient had care per Anesthesia. Recommendation:           - Patient has a contact number available for                            emergencies. The signs and symptoms of potential                            delayed complications were discussed with the                            patient. Return to normal activities tomorrow.                            Written discharge instructions were provided to the                            patient.                           - Resume previous diet.                           - Continue present medications.                           -  Repeat colonoscopy in 10 years for screening                            purposes.                           - If constipation persists, will further evaluate                            with Sitzmarker study and Anorectal Manometry.                           - Return to the GI clinic in 1-2 months, or sooner                            as needed.                           - Continue hydration as already doing.                           - Continue current laxative regimen.                           - Continue Xarelto in 2 days.                           - Depending on ongoing constipation and resutls of                            the above studies, can consider adding Trulance or                            Motegrity. Procedure Code(s):        --- Professional ---                           [phone], Colonoscopy, flexible; with control of                            bleeding, any method Diagnosis Code(s):        --- Professional ---                           K59.00, Constipation, unspecified                            Q43.8, Other specified congenital malformations of                            intestine CPT copyright 2019 [employer]. All rights reserved. The codes documented in this report are preliminary and upon coder review may  be revised to meet current compliance requirements. Gerrit Heck, MD 01/16/2020 10:49:22 AM Number of Addenda: 0

## 2020-01-16 NOTE — Anesthesia Postprocedure Evaluation (Signed)
Anesthesia Post Note  Patient: Amanda Clay  Procedure(s) Performed: COLONOSCOPY WITH PROPOFOL (N/A ) HEMOSTASIS CLIP PLACEMENT     Patient location during evaluation: Endoscopy Anesthesia Type: MAC Level of consciousness: awake Pain management: pain level controlled Vital Signs Assessment: post-procedure vital signs reviewed and stable Respiratory status: spontaneous breathing, nonlabored ventilation, respiratory function stable and patient connected to nasal cannula oxygen Cardiovascular status: stable and blood pressure returned to baseline Postop Assessment: no apparent nausea or vomiting Anesthetic complications: no    Last Vitals:  Vitals:   01/16/20 1050 01/16/20 1055  BP: 117/83   Pulse: (!) 108 (!) 108  Resp: (!) 23 (!) 21  Temp:    SpO2: 100% 100%    Last Pain:  Vitals:   01/16/20 1037  TempSrc:   PainSc: 0-No pain                 Gavyn Ybarra P Qadir Folks

## 2020-01-18 ENCOUNTER — Encounter: Payer: Self-pay | Admitting: *Deleted

## 2020-01-24 ENCOUNTER — Telehealth: Payer: Self-pay

## 2020-01-24 ENCOUNTER — Other Ambulatory Visit: Payer: Self-pay | Admitting: Gastroenterology

## 2020-01-24 DIAGNOSIS — K5900 Constipation, unspecified: Secondary | ICD-10-CM

## 2020-01-24 NOTE — Telephone Encounter (Signed)
Spoke with Burna Mortimer at [facility] to schedule Anorectal Manometry,and follow up appointment with MD after procedure. ARM scheduled for 03/14/20 Covid test 03/12/20. A follow up office visit made for 03/20/20. Instructions have been faxed to wanda at [phone]. She will call our office for any questions.

## 2020-03-12 ENCOUNTER — Other Ambulatory Visit (HOSPITAL_COMMUNITY)
Admission: RE | Admit: 2020-03-12 | Discharge: 2020-03-12 | Disposition: A | Discharge status: Home / Self Care | Payer: Medicare Other | Source: Ambulatory Visit | Attending: Gastroenterology | Admitting: Gastroenterology

## 2020-03-12 DIAGNOSIS — Z01812 Encounter for preprocedural laboratory examination: Secondary | ICD-10-CM | POA: Diagnosis present

## 2020-03-12 DIAGNOSIS — Z20822 Contact with and (suspected) exposure to covid-19: Secondary | ICD-10-CM | POA: Insufficient documentation

## 2020-03-12 LAB — SARS CORONAVIRUS 2 (TAT 6-24 HRS): SARS Coronavirus 2: NEGATIVE

## 2020-03-14 ENCOUNTER — Encounter (HOSPITAL_COMMUNITY)
Admission: RE | Disposition: A | Discharge status: Home / Self Care | Payer: Self-pay | Source: Home / Self Care | Attending: Gastroenterology

## 2020-03-14 ENCOUNTER — Ambulatory Visit (HOSPITAL_COMMUNITY)
Admission: RE | Admit: 2020-03-14 | Discharge: 2020-03-14 | Disposition: A | Discharge status: Home / Self Care | Payer: Medicare Other | Attending: Gastroenterology | Admitting: Gastroenterology

## 2020-03-14 DIAGNOSIS — K5902 Outlet dysfunction constipation: Secondary | ICD-10-CM

## 2020-03-14 DIAGNOSIS — K5900 Constipation, unspecified: Secondary | ICD-10-CM | POA: Insufficient documentation

## 2020-03-14 HISTORY — PX: ANAL RECTAL MANOMETRY: SHX6358

## 2020-03-14 SURGERY — MANOMETRY, ANORECTAL

## 2020-03-14 NOTE — Progress Notes (Signed)
Anolrectal manometry done per protocol. Patient tolerated test well. balloon expulsion test performed with 40 ml of water. Patient unable to expel balloon after 3 min. balloon was deflated and removed.report send to Gale Journey MD

## 2020-03-17 ENCOUNTER — Encounter (HOSPITAL_COMMUNITY): Payer: Self-pay | Admitting: Gastroenterology

## 2020-03-20 ENCOUNTER — Encounter: Payer: Self-pay | Admitting: Gastroenterology

## 2020-03-20 ENCOUNTER — Ambulatory Visit (INDEPENDENT_AMBULATORY_CARE_PROVIDER_SITE_OTHER): Payer: Medicare Other | Admitting: Gastroenterology

## 2020-03-20 VITALS — BP 104/72 | HR 88 | Ht <= 58 in | Wt 120.0 lb

## 2020-03-20 DIAGNOSIS — M6289 Other specified disorders of muscle: Secondary | ICD-10-CM

## 2020-03-20 DIAGNOSIS — K5900 Constipation, unspecified: Secondary | ICD-10-CM | POA: Diagnosis not present

## 2020-03-20 DIAGNOSIS — K219 Gastro-esophageal reflux disease without esophagitis: Secondary | ICD-10-CM | POA: Diagnosis not present

## 2020-03-20 NOTE — Patient Instructions (Addendum)
If you are age 38 or older, your body mass index should be between 23-30. Your Body mass index is 28.93 kg/m. If this is out of the aforementioned range listed, please consider follow up with your Primary Care Provider.  If you are age 37 or younger, your body mass index should be between 19-25. Your Body mass index is 28.93 kg/m. If this is out of the aformentioned range listed, please consider follow up with your Primary Care Provider.   You are being referred for pelvic floor retraining. You will be contacted with an appointment  Follow up with me in three to six months.  Please call the office for an appointment as the schedule is not available at this time.  It was a pleasure to see you today!  Vito Cirigliano, D.O.

## 2020-03-20 NOTE — Progress Notes (Signed)
P  Chief Complaint:    Constipation  GI History: Amanda Clay is a very pleasant 39 y.o. female with a history of cerebral palsy, major depressive disorder, hemiplegia, GERD, scoliosis, history of seizures, hypertension, overactive bladder, follows in the GI clinic for the following:  1) Chronic constipation: Longstanding history of constipation, but worse since 08/2019.  Has trialed multiple medications, to include MiraLAX, Colace, lactulose, Senokot, Gas-X.  Considerable straining to have BM.  Will push on abdomen to facilitate BM.  Consumes adequate amount of liquids. -Prescribed multiple medications, multiple of which w/ ADR constipation: oxybutynin (9-15%), tolterodine (7%),Topamax (1-4%), Neurontin (1-4%). -Colonoscopy (01/18/2020, Dr. Barron Alvine): Tortuous sigmoid colon, otherwise normal.  Normal TI.  Repeat 10 years -Anorectal manometry (03/14/2020): Tolerated test well.  Unable to expel 40 mL balloon.  Remainder of report pending.  2) GERD: Longstanding history of GERD, with index symptoms of heartburn and regurgitation.  Well-controlled with Dexilant 60 mg/day.  Also takes Reglan 5 mg prior to meals.  3) Dysphagia: Solid food dysphagia starting early 2021. -EGD (12/17/2019, Dr. Barron Alvine): Normal esophagus, empiric dilation with 54 Fr Maloney, Hill grade 4 valve, benign gastric polyps, retained food in stomach, normal duodenum.  Consider GES -No dysphagia since EGD   HPI:     Patient is a 39 y.o. female presenting to the Gastroenterology Clinic for follow-up.  Since initial appointment with me, has completed EGD and colonoscopy as outlined above, along with anorectal manometry.  Finalized report of ARM not yet complete, but per notes, unable to expel 40 mL balloon within 3 minutes.  She is otherwise in her usual state of health.  Good p.o. intake.  No significant change in bowel habits.  Unable to have BM without use of laxatives/stool softeners.  Reflux well controlled.  No  additional complaints today.    Review of systems:     No chest pain, no SOB, no fevers, no urinary sx   Past Medical History:  Diagnosis Date  . Acute cystitis without hematuria   . Anxiety   . Cerebral palsy (HCC)   . Constipation   . Endometriosis   . Essential hypertension   . GAD (generalized anxiety disorder)   . GERD (gastroesophageal reflux disease)   . Hemiplegia and hemiparesis   . Hypertension   . IBS (irritable bowel syndrome)   . Insomnia   . Major depressive disorder   . Migraine   . Muscle weakness (generalized)   . Neuropathy    poly  . Overactive bladder   . Panic attacks   . Polyneuropathy   . Scoliosis   . Seizures (HCC)    x 4 unknown etiology  last 3 years ago  . Shortness of breath dyspnea    when on back,scoliosis  . Stroke (HCC)    12/2012  . Urge incontinence   . Urinary retention   . Vitamin deficiency    unspecified  . Yeast vaginitis     Patient's surgical history, family medical history, social history, medications and allergies were all reviewed in Epic    Current Outpatient Medications  Medication Sig Dispense Refill  . bisacodyl (DULCOLAX) 10 MG suppository Place 10 mg rectally every other day. (2000)    . busPIRone (BUSPAR) 10 MG tablet Take 10 mg by mouth 3 (three) times daily. (0800, 1400, & 2000)    . clonazePAM (KLONOPIN) 0.5 MG tablet Take 0.25 mg by mouth 2 (two) times daily as needed (control).     . clonazePAM (KLONOPIN)  0.5 MG tablet Take 0.25 mg by mouth in the morning and at bedtime. (0800 & 2000)    . Cranberry 450 MG CAPS Take 450 mg by mouth 2 (two) times daily. (0800 & 2000)    . cyclobenzaprine (FLEXERIL) 10 MG tablet Take 10 mg by mouth daily as needed for muscle spasms.     Marland Kitchen dexlansoprazole (DEXILANT) 60 MG capsule Take 60 mg by mouth daily. (0700)    . gabapentin (NEURONTIN) 300 MG capsule Take 300 mg by mouth 3 (three) times daily. (0800, 1400, 2000)    . ketorolac (ACULAR) 0.5 % ophthalmic solution Place 1  drop into the right eye daily as needed (allergies).     . lactulose (CHRONULAC) 10 GM/15ML solution Take 20 g by mouth in the morning and at bedtime. (0800 & 2000)    . meclizine (ANTIVERT) 25 MG tablet Take 25 mg by mouth every 6 (six) hours as needed for dizziness.    . medroxyPROGESTERone (DEPO-PROVERA) 150 MG/ML injection Inject 150 mg into the muscle every 3 (three) months.    . Melatonin 5 MG TABS Take 5 mg by mouth at bedtime. (2000)    . metoCLOPramide (REGLAN) 5 MG tablet Take 5 mg by mouth with breakfast, with lunch, and with evening meal. (0730, 1130, & 1630)    . mirabegron ER (MYRBETRIQ) 25 MG TB24 tablet Take 25 mg by mouth daily. (0800)    . Multiple Vitamin (MULTIVITAMIN WITH MINERALS) TABS tablet Take 1 tablet by mouth daily. (0800)    . ondansetron (ZOFRAN) 4 MG tablet Take Zofran 4 mg every 6 hours as needed for nausea (Patient taking differently: Take 4 mg by mouth every 6 (six) hours as needed for nausea or vomiting. ) 12 tablet 0  . oxybutynin (DITROPAN) 5 MG tablet Take 5 mg by mouth daily. (0800)    . polyethylene glycol (MIRALAX / GLYCOLAX) 17 g packet Take 17 g by mouth at bedtime. (2000)    . prazosin (MINIPRESS) 1 MG capsule Take 1 mg by mouth at bedtime. (2000)    . rivaroxaban (XARELTO) 20 MG TABS tablet Take 20 mg by mouth daily. (0800)    . senna-docusate (SENOKOT-S) 8.6-50 MG tablet Take 1 tablet by mouth 2 (two) times daily. (0800 & 2000)    . sertraline (ZOLOFT) 100 MG tablet Take 200 mg by mouth daily. (0800)    . Simethicone (GAS RELIEF 125 MAX ST PO) Take 125 mg by mouth every 6 (six) hours as needed (Abdominal pressure/bloating). Do not crush    . Sunscreens (COPPERTONE SPORT SPF15) LOTN Apply 1 application topically as needed (prior to sitting outside).    . tolterodine (DETROL LA) 4 MG 24 hr capsule Take 4 mg by mouth daily. (0800)    . topiramate (TOPAMAX) 50 MG tablet Take 50 mg by mouth 2 (two) times daily. (0800 & 2000)    . Vitamins A & D (VITAMIN A &  D) ointment Apply 1 application topically as needed for dry skin (applied to sacral area after each brief change/toileting.).      No current facility-administered medications for this visit.    Physical Exam:     BP 104/72   Pulse 88   Ht 4\' 6"  (1.372 m)   Wt 120 lb (54.4 kg) Comment: verbalized  BMI 28.93 kg/m   GENERAL:  Pleasant female in NAD.  In motorized wheelchair PSYCH: : Cooperative, normal affect ABDOMEN:  Nondistended, soft, nontender. No obvious masses, no hepatomegaly,  normal  bowel sounds NEURO: Alert and oriented x 3   IMPRESSION and PLAN:    1) Chronic Constipation 2) Pelvic floor dyssynergia Clinical presentation preliminary results of ARM highly suspicious for Pelvic Floor Dyssynergia.  Discussed the diagnosis at length today and proceed as follows: -Referral to PT for pelvic floor retraining and biofeedback -Continue laxatives and stool softeners as currently doing until able to be seen by PT -Continue adequate hydration  3) GERD 4) Dysphagia-resolved -Well-controlled on current therapy -Significant LES laxity with Hill grade 4 valve, but no hiatal hernia on EGD -Dysphagia resolved with EGD with empiric Maloney dilation -Moderate amount of residual food in stomach at time of EGD.  However, no nausea/vomiting.  If recurrence of upper GI symptoms, plan for GES  RTC in 3 months or sooner as needed  I spent 30 minutes of time, including in depth chart review, independent review of results as outlined above, communicating results with the patient directly, face-to-face time with the patient, coordinating care, and ordering studies and medications as appropriate, and documentation.            Shellia Cleverly ,DO, FACG 03/20/2020, 10:41 AM

## 2020-03-25 DIAGNOSIS — K5902 Outlet dysfunction constipation: Secondary | ICD-10-CM

## 2020-06-03 ENCOUNTER — Ambulatory Visit: Payer: Medicare Other | Admitting: Physical Therapy

## 2020-06-10 ENCOUNTER — Ambulatory Visit: Payer: Medicare Other | Admitting: Physical Therapy

## 2020-06-17 ENCOUNTER — Ambulatory Visit: Payer: Medicare Other | Attending: Gastroenterology | Admitting: Physical Therapy

## 2020-06-24 ENCOUNTER — Encounter: Payer: Medicare Other | Admitting: Physical Therapy

## 2022-03-10 ENCOUNTER — Ambulatory Visit: Payer: Medicare Other | Admitting: Podiatry

## 2022-03-19 ENCOUNTER — Ambulatory Visit: Payer: Medicare Other | Admitting: Podiatry

## 2022-03-22 ENCOUNTER — Ambulatory Visit: Payer: Medicare Other | Admitting: Podiatry

## 2023-04-22 DIAGNOSIS — R000 Tachycardia, unspecified: Secondary | ICD-10-CM

## 2023-11-24 ENCOUNTER — Ambulatory Visit: Admitting: Podiatry

## 2023-12-14 ENCOUNTER — Ambulatory Visit (INDEPENDENT_AMBULATORY_CARE_PROVIDER_SITE_OTHER): Admitting: Podiatry

## 2023-12-14 DIAGNOSIS — L600 Ingrowing nail: Secondary | ICD-10-CM | POA: Diagnosis not present

## 2023-12-14 MED ORDER — GENTAMICIN SULFATE 0.1 % EX OINT: 1.0000 | TOPICAL_OINTMENT | Freq: Every day | CUTANEOUS | 0 refills | Status: DC

## 2023-12-14 NOTE — Patient Instructions (Signed)
 Place 1/4 cup of epsom salts in a quart of warm tap water.  Submerge your foot or feet in the solution and soak for 20 minutes.  This soak should be done twice a day.  Next, remove your foot or feet from solution, blot dry the affected area. Apply ointment and cover if instructed by your doctor.   IF YOUR SKIN BECOMES IRRITATED WHILE USING THESE INSTRUCTIONS, IT IS OKAY TO SWITCH TO  WHITE VINEGAR AND WATER.  As another alternative soak, you may use antibacterial soap and water.  Dry the area well after soaking.  Apply a small amount of antibiotic ointment to the area, followed by a Bandaid or light gauze dressing.  As the area starts to dry out over the next few days, you can remove the covering at bedtime to encourage the toe to dry out more.  Once it forms a dry scab, you can discontinue these instructions.  Monitor for any signs/symptoms of infection. Call the office immediately if any occur or go directly to the emergency room. Call with any questions/concerns.

## 2023-12-14 NOTE — Progress Notes (Signed)
 Subjective:  Patient ID: Amanda Clay, female    DOB: May 14, 1981,  MRN: [id number]  Glyn Laser presents to clinic today for:  Chief Complaint  Patient presents with   Ingrown Toenail    Bilateral 1st toes, bilateral borders. They are infected, she has had the PNA of both seven times. The podiatrist at the facility did temporary PNA. She did not numb well. Transport lady is with her.   Patient presents with concern of recurring ingrown toenails to the great toes.  States that she has seen a podiatrist who has performed ingrown nail procedures at least 4-5 times and they keep growing back.  Unsure whether a chemical matrixectomy had been performed.  Patient is in a wheelchair today and there is a caregiver with her during the appointment.  Patient states that she did have some difficulty getting numb during previous nail avulsions.  States that there has been some drainage from both great toenail borders  PCP is Georgean Kindle, MD.  Allergies  Allergen Reactions   Penicillin G Swelling   Sulfa Antibiotics Hives, Itching and Swelling    Review of Systems: Negative except as noted in the HPI.  Objective:  Vascular Examination: Capillary refill time is 3-5 seconds to toes bilateral. Palpable pedal pulses b/l LE. Digital hair present b/l.  Mild pedal edema b/l. Skin temperature gradient WNL b/l.   Dermatological Examination: There is incurvation of the bilateral hallux, medial and lateral nail border.  There is pain on palpation of the affected nail borders.  There is dried blood drainage present along the medial and lateral nail borders.  No active purulence is noted.    Neurological Examination: Epicritic sensation is intact bilateral  Assessment/Plan: 1. Ingrown toenail     Meds ordered this encounter  Medications   gentamicin ointment (GARAMYCIN) 0.1 %    Sig: Apply 1 Application topically daily. Apply small amount to both great toenails along the surgical areas.   Then cover with a bandaid.  Start applying on 12/15/23.  Change once daily.    Dispense:  30 g    Refill:  0   Discussed patient's condition today.  After obtaining patient consent, the bilateral hallux was anesthetized with a 50:50 mixture of 1% lidocaine plain and 0.5% bupivacaine plain for a total of 3cc's administered.  Upon confirmation of anesthesia, a freer elevator was utilized to free the bilateral hallux, medial and lateral nail border from the nail bed.  The nail borders were then avulsed proximal to the eponychium and removed in toto.  The area was inspected for any remaining spicules.  A chemical matrixectomy was performed with NaOH and neutralized with acetic acid solution.  Antibiotic ointment and a DSD were applied, followed by a Coban dressing.  Patient tolerated the anesthetic and procedures well and will f/u in 2-3 weeks for recheck.  Patient given post-procedure instructions for daily 15-minute Epsom salt soaks, antibiotic ointment and daily use of Bandaids until toe starts to dry / form eschar.   Prescription for gentamicin ointment sent to her pharmacy to apply a small amount along the toenail borders daily.  Orders written for her care facility for daily instructions.  Follow-up in approximately 2 weeks for PNA recheck   Demaryius Imran Jenita Miyamoto, DPM, FACFAS [facility]     [address]  [place], [place] [address]                Office [phone]  Fax [phone]

## 2023-12-29 ENCOUNTER — Ambulatory Visit (INDEPENDENT_AMBULATORY_CARE_PROVIDER_SITE_OTHER): Admitting: Podiatry

## 2023-12-29 DIAGNOSIS — L03032 Cellulitis of left toe: Secondary | ICD-10-CM

## 2023-12-29 DIAGNOSIS — L03031 Cellulitis of right toe: Secondary | ICD-10-CM | POA: Diagnosis not present

## 2023-12-29 NOTE — Progress Notes (Signed)
 Chief Complaint  Patient presents with   Ingrown Toenail    PNA check, bilateral 1st, bilateral borders. They are infected. She states the facility only soaked them one time but the documentation shows they did. Conservator, museum/gallery are with her today.  The facility is giving them a hard time regarding the soaking, aunt had to go out and but epsom salt and they still refused to soak stating they had no orders. The facility doctor put her on ABX Doxcycline but finished it on the 20th.    HPI: 43 y.o. female presents today for follow-up of bilateral hallux, bilateral border PNA's.  There are 2 family members/caregivers with the patient today.  She is in a wheelchair.  They are visibly upset stating that the facility has not been performing any of the daily care that was necessary to be performed on the toes daily.  They also noted that the facility has not been performing Epsom salt soaks to the toes.  She was seen by the PCP and was placed on antibiotics.  They state that the toes look a little bit better today from when they had to call the primary care physician for the antibiotics  Past Medical History:  Diagnosis Date   Acute cystitis without hematuria    Anxiety    Cerebral palsy (HCC)    Constipation    Endometriosis    Essential hypertension    GAD (generalized anxiety disorder)    GERD (gastroesophageal reflux disease)    Hemiplegia and hemiparesis    Hypertension    IBS (irritable bowel syndrome)    Insomnia    Major depressive disorder    Migraine    Muscle weakness (generalized)    Neuropathy    poly   Overactive bladder    Panic attacks    Polyneuropathy    Scoliosis    Seizures (HCC)    x 4 unknown etiology  last 3 years ago   Shortness of breath dyspnea    when on back,scoliosis   Stroke (HCC)    12/2012   Urge incontinence    Urinary retention    Vitamin deficiency    unspecified   Yeast vaginitis     Past Surgical History:  Procedure  Laterality Date   abducter surgery  2000   ANAL RECTAL MANOMETRY N/A 03/14/2020   Procedure: ANO RECTAL MANOMETRY;  Surgeon: Nandigam, Kavitha V, MD;  Location: [facility];  Service: Endoscopy;  Laterality: N/A;   BIOPSY  12/17/2019   Procedure: BIOPSY;  Surgeon: Annis Kinder, DO;  Location: [facility];  Service: Gastroenterology;;   COLONOSCOPY WITH PROPOFOL  N/A 01/16/2020   Procedure: COLONOSCOPY WITH PROPOFOL ;  Surgeon: Annis Kinder, DO;  Location: [facility];  Service: Gastroenterology;  Laterality: N/A;   CORONARY ANGIOPLASTY  2016   ESOPHAGOGASTRODUODENOSCOPY     more than 10 years ago   ESOPHAGOGASTRODUODENOSCOPY (EGD) WITH PROPOFOL  N/A 12/17/2019   Procedure: ESOPHAGOGASTRODUODENOSCOPY (EGD) WITH PROPOFOL ;  Surgeon: Annis Kinder, DO;  Location: [facility];  Service: Gastroenterology;  Laterality: N/A;  EGD with dilation   eye surgeries  1993   HEMOSTASIS CLIP PLACEMENT  01/16/2020   Procedure: HEMOSTASIS CLIP PLACEMENT;  Surgeon: Annis Kinder, DO;  Location: [facility];  Service: Gastroenterology;;   HYSTEROSCOPY WITH D & C  08/22/2015   Procedure: DILATATION AND CURETTAGE /HYSTEROSCOPY;  Surgeon: Cassandra Cleveland, MD;  Location: [facility];  Service: Gynecology;;   INTRAUTERINE DEVICE (IUD) INSERTION N/A 08/22/2015  Procedure: INTRAUTERINE DEVICE (IUD) INSERTION;  Surgeon: Cassandra Cleveland, MD;  Location: [facility];  Service: Gynecology;  Laterality: N/A;   leg surgeries  2008   MALONEY DILATION  12/17/2019   Procedure: MALONEY DILATION;  Surgeon: Annis Kinder, DO;  Location: [facility];  Service: Gastroenterology;;   MELANOMA EXCISION  2001   PERIPHERAL VASCULAR CATHETERIZATION N/A 04/02/2015   Procedure: PICC Line Insertion;  Surgeon: Jackquelyn Mass, MD;  Location: [facility];  Service: Cardiovascular;  Laterality: N/A;    Allergies  Allergen Reactions   Penicillin G Swelling   Sulfa Antibiotics Hives, Itching and Swelling     Physical Exam: There are palpable pedal pulses.  Mild generalized edema to the ankle and foot bilateral.  The bilateral hallux is erythematous with moderate eschar buildup from drainage that has not been cleaned away and has started to dry onto the toes.  There is pain on palpation of the bilateral hallux around the toenail areas.  No active purulence or malodor is noted.  Assessment/Plan of Care: 1. Cellulitis of great toe of left foot   2. Cellulitis of great toe, right    Discussed clinical findings with patient today, as well as her family/caregivers who are present.  Will type new, very specific, orders for daily care to be performed to the toes.  That this was signed and dispensed to the patient at checkout.  The medical assistant performed a sterile skin scrub to the bilateral hallux to remove the buildup of gelatinous drainage that has accumulated over the past 2 weeks.  Iodosorb ointment and a dry sterile dressing was applied to each great toe.  They were given a small goody bag to get started on daily wound supplies care.  They will use the Iodosorb ointment daily.  Follow-up in 2 weeks if she does not have complete resolution.  Patient to complete the antibiotics (doxycycline 100 mg 1 tablet p.o. twice daily) that were prescribed by her PCP.   Joe Murders, DPM, FACFAS [facility]     [address] [place], [place] [address]                Office [phone]  Fax [phone]

## 2024-01-04 ENCOUNTER — Telehealth: Payer: Self-pay

## 2024-01-04 NOTE — Telephone Encounter (Signed)
 Amanda Clay called and left a message - patient finished antibiotics - they are concerned because the toe still looks infected - does she need another round of antibiotics? Or does she need to be seen again? Please call Charolotte Copp [phone]

## 2024-01-05 ENCOUNTER — Other Ambulatory Visit: Payer: Self-pay | Admitting: Podiatry

## 2024-01-05 MED ORDER — DOXYCYCLINE HYCLATE 100 MG PO CAPS: 100.0000 mg | ORAL_CAPSULE | Freq: Two times a day (BID) | ORAL | 0 refills | Status: AC

## 2024-01-13 ENCOUNTER — Ambulatory Visit (INDEPENDENT_AMBULATORY_CARE_PROVIDER_SITE_OTHER): Admitting: Podiatry

## 2024-01-13 DIAGNOSIS — L03032 Cellulitis of left toe: Secondary | ICD-10-CM | POA: Diagnosis not present

## 2024-01-15 NOTE — Progress Notes (Signed)
 Chief Complaint  Patient presents with   PNA check    Recheck of the bilateral first toes, they are still hurting her, the left one is more painful then the right one. The right one hurts if it gets bumped but the left one hurts the most all the time. They are still scabbed, draining and swollen a little. Not diabetic and no anti coag.     HPI: 43 y.o. female presents today for recheck of the infection following her PNA procedures to the bilateral hallux toes.  The family/caregivers state that there has been improvement of daily cleansing of the toes by one of the aides but not all of the aides.  There is still some crusted drainage on the toes.  Past Medical History:  Diagnosis Date   Acute cystitis without hematuria    Anxiety    Cerebral palsy (HCC)    Constipation    Endometriosis    Essential hypertension    GAD (generalized anxiety disorder)    GERD (gastroesophageal reflux disease)    Hemiplegia and hemiparesis    Hypertension    IBS (irritable bowel syndrome)    Insomnia    Major depressive disorder    Migraine    Muscle weakness (generalized)    Neuropathy    poly   Overactive bladder    Panic attacks    Polyneuropathy    Scoliosis    Seizures (HCC)    x 4 unknown etiology  last 3 years ago   Shortness of breath dyspnea    when on back,scoliosis   Stroke (HCC)    12/2012   Urge incontinence    Urinary retention    Vitamin deficiency    unspecified   Yeast vaginitis    Past Surgical History:  Procedure Laterality Date   abducter surgery  2000   ANAL RECTAL MANOMETRY N/A 03/14/2020   Procedure: ANO RECTAL MANOMETRY;  Surgeon: Nandigam, Kavitha V, MD;  Location: [facility];  Service: Endoscopy;  Laterality: N/A;   BIOPSY  12/17/2019   Procedure: BIOPSY;  Surgeon: Annis Kinder, DO;  Location: [facility];  Service: Gastroenterology;;   COLONOSCOPY WITH PROPOFOL  N/A 01/16/2020   Procedure: COLONOSCOPY WITH PROPOFOL ;  Surgeon: Annis Kinder, DO;   Location: [facility];  Service: Gastroenterology;  Laterality: N/A;   CORONARY ANGIOPLASTY  2016   ESOPHAGOGASTRODUODENOSCOPY     more than 10 years ago   ESOPHAGOGASTRODUODENOSCOPY (EGD) WITH PROPOFOL  N/A 12/17/2019   Procedure: ESOPHAGOGASTRODUODENOSCOPY (EGD) WITH PROPOFOL ;  Surgeon: Annis Kinder, DO;  Location: [facility];  Service: Gastroenterology;  Laterality: N/A;  EGD with dilation   eye surgeries  1993   HEMOSTASIS CLIP PLACEMENT  01/16/2020   Procedure: HEMOSTASIS CLIP PLACEMENT;  Surgeon: Annis Kinder, DO;  Location: [facility];  Service: Gastroenterology;;   HYSTEROSCOPY WITH D & C  08/22/2015   Procedure: DILATATION AND CURETTAGE /HYSTEROSCOPY;  Surgeon: Cassandra Cleveland, MD;  Location: [facility];  Service: Gynecology;;   INTRAUTERINE DEVICE (IUD) INSERTION N/A 08/22/2015   Procedure: INTRAUTERINE DEVICE (IUD) INSERTION;  Surgeon: Cassandra Cleveland, MD;  Location: [facility];  Service: Gynecology;  Laterality: N/A;   leg surgeries  2008   MALONEY DILATION  12/17/2019   Procedure: MALONEY DILATION;  Surgeon: Annis Kinder, DO;  Location: [facility];  Service: Gastroenterology;;   MELANOMA EXCISION  2001   PERIPHERAL VASCULAR CATHETERIZATION N/A 04/02/2015   Procedure: PICC Line Insertion;  Surgeon: Jackquelyn Mass, MD;  Location: [facility];  Service: Cardiovascular;  Laterality: N/A;   Allergies  Allergen Reactions   Penicillin G Swelling   Sulfa Antibiotics Hives, Itching and Swelling   Physical Exam: The right hallux PNA sites look very good with no signs of infection.  The erythema has resolved.  There is dry eschar along the nail margins.  The left hallux still has evidence of moderate crusting/drainage from the proximal nail fold.  There is improvement of the erythema and edema to the toe.  Less tenderness on palpation than on last visit.  Assessment/Plan of Care: 1. Cellulitis of great toe of left foot    Anesthetic spray was applied to  the toes today to allow for some gentle cleansing of the areas.  Patient did not tolerate any of this very well.  A curette was also utilized to try to remove some of the crusting/eschar from the drainage that is caked onto the toe and toenail.  She also did not tolerate this well and asked that we stop.  Her daily instructions for soaks and cleansing of the toes were renewed today for an additional 2 weeks.  This should resolve the remainder of the healing uneventfully.  Follow-up as needed   Joe Murders, DPM, FACFAS [facility]     [address] [place], [place] [address]                Office [phone]  Fax [phone]

## 2024-01-27 ENCOUNTER — Ambulatory Visit (INDEPENDENT_AMBULATORY_CARE_PROVIDER_SITE_OTHER): Admitting: Podiatry

## 2024-01-27 DIAGNOSIS — L03032 Cellulitis of left toe: Secondary | ICD-10-CM

## 2024-01-27 MED ORDER — DOXYCYCLINE HYCLATE 100 MG PO CAPS: 100.0000 mg | ORAL_CAPSULE | Freq: Two times a day (BID) | ORAL | 0 refills | Status: AC

## 2024-01-27 NOTE — Progress Notes (Unsigned)
 Chief Complaint  Patient presents with   PNA Check    PNA recheck. Looking better. Cleanning and rewrapping.    HPI: 43 y.o. female presents today for follow-up after P&A of the bilateral borders of the bilateral hallux toenails.  A companion is with her today.  She stated that the left toe is still quite sore.  She has been taking the antibiotics previously and finish those.  Past Medical History:  Diagnosis Date   Acute cystitis without hematuria    Anxiety    Cerebral palsy (HCC)    Constipation    Endometriosis    Essential hypertension    GAD (generalized anxiety disorder)    GERD (gastroesophageal reflux disease)    Hemiplegia and hemiparesis    Hypertension    IBS (irritable bowel syndrome)    Insomnia    Major depressive disorder    Migraine    Muscle weakness (generalized)    Neuropathy    poly   Overactive bladder    Panic attacks    Polyneuropathy    Scoliosis    Seizures (HCC)    x 4 unknown etiology  last 3 years ago   Shortness of breath dyspnea    when on back,scoliosis   Stroke (HCC)    12/2012   Urge incontinence    Urinary retention    Vitamin deficiency    unspecified   Yeast vaginitis    Past Surgical History:  Procedure Laterality Date   abducter surgery  2000   ANAL RECTAL MANOMETRY N/A 03/14/2020   Procedure: ANO RECTAL MANOMETRY;  Surgeon: Nandigam, Kavitha V, MD;  Location: [facility];  Service: Endoscopy;  Laterality: N/A;   BIOPSY  12/17/2019   Procedure: BIOPSY;  Surgeon: Annis Kinder, DO;  Location: [facility];  Service: Gastroenterology;;   COLONOSCOPY WITH PROPOFOL  N/A 01/16/2020   Procedure: COLONOSCOPY WITH PROPOFOL ;  Surgeon: Annis Kinder, DO;  Location: [facility];  Service: Gastroenterology;  Laterality: N/A;   CORONARY ANGIOPLASTY  2016   ESOPHAGOGASTRODUODENOSCOPY     more than 10 years ago   ESOPHAGOGASTRODUODENOSCOPY (EGD) WITH PROPOFOL  N/A 12/17/2019   Procedure: ESOPHAGOGASTRODUODENOSCOPY (EGD) WITH  PROPOFOL ;  Surgeon: Annis Kinder, DO;  Location: [facility];  Service: Gastroenterology;  Laterality: N/A;  EGD with dilation   eye surgeries  1993   HEMOSTASIS CLIP PLACEMENT  01/16/2020   Procedure: HEMOSTASIS CLIP PLACEMENT;  Surgeon: Annis Kinder, DO;  Location: [facility];  Service: Gastroenterology;;   HYSTEROSCOPY WITH D & C  08/22/2015   Procedure: DILATATION AND CURETTAGE /HYSTEROSCOPY;  Surgeon: Cassandra Cleveland, MD;  Location: [facility];  Service: Gynecology;;   INTRAUTERINE DEVICE (IUD) INSERTION N/A 08/22/2015   Procedure: INTRAUTERINE DEVICE (IUD) INSERTION;  Surgeon: Cassandra Cleveland, MD;  Location: [facility];  Service: Gynecology;  Laterality: N/A;   leg surgeries  2008   MALONEY DILATION  12/17/2019   Procedure: MALONEY DILATION;  Surgeon: Annis Kinder, DO;  Location: [facility];  Service: Gastroenterology;;   MELANOMA EXCISION  2001   PERIPHERAL VASCULAR CATHETERIZATION N/A 04/02/2015   Procedure: PICC Line Insertion;  Surgeon: Jackquelyn Mass, MD;  Location: [facility];  Service: Cardiovascular;  Laterality: N/A;   Allergies  Allergen Reactions   Penicillin G Swelling   Sulfa Antibiotics Hives, Itching and Swelling    Physical Exam: The right hallux is almost completely resolved now.  A dry eschar has formed and the erythema is resolved on the right  great toe.  The left still has evidence of some drainage and some mild maceration.  Overall this looks much better and the erythema has decreased significantly.  Edema is diminished significantly.  Pain on palpation to the left great toe nail margins.  Assessment/Plan of Care: 1. Cellulitis of great toe of left foot     Meds ordered this encounter  Medications   doxycycline  (VIBRAMYCIN ) 100 MG capsule    Sig: Take 1 capsule (100 mg total) by mouth 2 (two) times daily for 7 days.    Dispense:  14 capsule    Refill:  0   Both toes were gently cleansed and scrubbed with Hibiclens solution and  dried well.  Iodine ointment and light gauze dressing was applied to each toe  Patient was very concerned because the left toe is still quite tender compared to the right.  Informed her that it just appears that the right has healed slightly quicker than the left.  Will go ahead and renew her doxycycline  100 mg twice daily x 7 days since occasionally, the care of her toes at the facility has been in question by the caregiver that is with her.  Will also be ordered the daily care to be performed to the toes at her facility for another 2 weeks.  At that time this should be completely resolved.  At this point we will follow-up as needed.   Joe Murders, DPM, FACFAS [facility]     [address] [place], [place] [address]                Office [phone]  Fax [phone]

## 2024-10-08 ENCOUNTER — Emergency Department

## 2024-10-08 ENCOUNTER — Encounter: Payer: Self-pay | Admitting: Emergency Medicine

## 2024-10-08 ENCOUNTER — Ambulatory Visit

## 2024-10-08 ENCOUNTER — Emergency Department: Admission: EM | Admit: 2024-10-08 | Discharge: 2024-10-09 | Disposition: A

## 2024-10-08 ENCOUNTER — Other Ambulatory Visit: Payer: Self-pay

## 2024-10-08 DIAGNOSIS — N390 Urinary tract infection, site not specified: Secondary | ICD-10-CM | POA: Insufficient documentation

## 2024-10-08 DIAGNOSIS — R935 Abnormal findings on diagnostic imaging of other abdominal regions, including retroperitoneum: Secondary | ICD-10-CM | POA: Insufficient documentation

## 2024-10-08 LAB — URINALYSIS, ROUTINE W REFLEX MICROSCOPIC
Bacteria, UA: NONE SEEN
RBC / HPF: >50 RBC/hpf (ref 0–5)
Squamous Epithelial / HPF: 0 /HPF (ref 0–5)

## 2024-10-08 LAB — COMPREHENSIVE METABOLIC PANEL WITH GFR
ALT: 13 U/L (ref 0–44)
AST: 15 U/L (ref 15–41)
Albumin: 4 g/dL (ref 3.5–5.0)
Alkaline Phosphatase: 107 U/L (ref 38–126)
Anion gap: 13 (ref 5–15)
BUN: 15 mg/dL (ref 6–20)
CO2: 21 mmol/L — ABNORMAL LOW (ref 22–32)
Calcium: 9.7 mg/dL (ref 8.9–10.3)
Chloride: 111 mmol/L (ref 98–111)
Creatinine, Ser: 1 mg/dL (ref 0.44–1.00)
GFR, Estimated: >60 mL/min
Glucose, Bld: 111 mg/dL — ABNORMAL HIGH (ref 70–99)
Potassium: 3.4 mmol/L — ABNORMAL LOW (ref 3.5–5.1)
Sodium: 145 mmol/L (ref 135–145)
Total Bilirubin: <0.2 mg/dL (ref 0.0–1.2)
Total Protein: 7.1 g/dL (ref 6.5–8.1)

## 2024-10-08 LAB — CBC
HCT: 35.3 % — ABNORMAL LOW (ref 36.0–46.0)
Hemoglobin: 11 g/dL — ABNORMAL LOW (ref 12.0–15.0)
MCH: 27.1 pg (ref 26.0–34.0)
MCHC: 31.2 g/dL (ref 30.0–36.0)
MCV: 86.9 fL (ref 80.0–100.0)
Platelets: 234 10*3/uL (ref 150–400)
RBC: 4.06 MIL/uL (ref 3.87–5.11)
RDW: 16.8 % — ABNORMAL HIGH (ref 11.5–15.5)
WBC: 8.3 10*3/uL (ref 4.0–10.5)
nRBC: 0 % (ref 0.0–0.2)

## 2024-10-08 LAB — LIPASE, BLOOD: Lipase: 56 U/L — ABNORMAL HIGH (ref 11–51)

## 2024-10-08 LAB — HCG, QUANTITATIVE, PREGNANCY: hCG, Beta Chain, Quant, S: <1 m[IU]/mL

## 2024-10-08 MED ORDER — METRONIDAZOLE 500 MG PO TABS
500.0000 mg | ORAL_TABLET | Freq: Once | ORAL | Status: AC
  Administered 2024-10-09: 500 mg via ORAL
  Filled 2024-10-08: qty 1

## 2024-10-08 MED ORDER — CIPROFLOXACIN HCL 500 MG PO TABS
500.0000 mg | ORAL_TABLET | Freq: Once | ORAL | Status: AC
  Administered 2024-10-09: 500 mg via ORAL
  Filled 2024-10-08: qty 1

## 2024-10-08 MED ORDER — FAMOTIDINE IN NACL 20-0.9 MG/50ML-% IV SOLN
20.0000 mg | Freq: Once | INTRAVENOUS | Status: AC
  Administered 2024-10-08: 20 mg via INTRAVENOUS
  Filled 2024-10-08: qty 50

## 2024-10-08 MED ORDER — IOHEXOL 300 MG/ML  SOLN
100.0000 mL | Freq: Once | INTRAMUSCULAR | Status: AC | PRN
  Administered 2024-10-08: 100 mL via INTRAVENOUS

## 2024-10-08 MED ORDER — METRONIDAZOLE 500 MG PO TABS: 500.0000 mg | ORAL_TABLET | Freq: Two times a day (BID) | ORAL | 0 refills | Status: AC

## 2024-10-08 MED ORDER — DIPHENHYDRAMINE HCL 50 MG/ML IJ SOLN
12.5000 mg | Freq: Once | INTRAMUSCULAR | Status: AC
  Administered 2024-10-08: 12.5 mg via INTRAVENOUS
  Filled 2024-10-08: qty 1

## 2024-10-08 MED ORDER — CIPROFLOXACIN HCL 500 MG PO TABS: 500.0000 mg | ORAL_TABLET | Freq: Two times a day (BID) | ORAL | 0 refills | Status: AC

## 2024-10-08 MED ORDER — SODIUM CHLORIDE 0.9 % IV BOLUS
1000.0000 mL | Freq: Once | INTRAVENOUS | Status: AC
  Administered 2024-10-08: 1000 mL via INTRAVENOUS

## 2024-10-08 NOTE — ED Notes (Addendum)
 Pt incont. W/stool. Pt cleaned by this tech and Ramos, [place]. Peri care performed and new brief placed on pt. While pt was on bedpan attempting to give urine sample, pt began to have a bowel movement, unable to collect urine sample due to contamination. RN notified.

## 2024-10-09 MED ORDER — GABAPENTIN 300 MG PO CAPS
300.0000 mg | ORAL_CAPSULE | Freq: Once | ORAL | Status: AC
  Administered 2024-10-09: 300 mg via ORAL
  Filled 2024-10-09: qty 1

## 2024-10-10 ENCOUNTER — Ambulatory Visit

## 2024-10-10 VITALS — BP 102/76 | HR 99 | Ht <= 58 in | Wt 147.6 lb

## 2024-10-10 DIAGNOSIS — I479 Paroxysmal tachycardia, unspecified: Secondary | ICD-10-CM | POA: Diagnosis not present

## 2024-10-10 DIAGNOSIS — I3139 Other pericardial effusion (noninflammatory): Secondary | ICD-10-CM

## 2024-10-10 NOTE — Patient Instructions (Addendum)
 Medication Instructions:   Your physician recommends the following medication changes.  STOP TAKING:  Metoprolol 25 mg once daily   *If you need a refill on your cardiac medications before your next appointment, please call your pharmacy*  Lab Work:  No labs ordered today   If you have labs (blood work) drawn today and your tests are completely normal, you will receive your results only by: MyChart Message (if you have MyChart) OR A paper copy in the mail If you have any lab test that is abnormal or we need to change your treatment, we will call you to review the results.  Testing/Procedures:  (Schedule after 10/25/2024) Your physician has requested that you have an echocardiogram. Echocardiography is a painless test that uses sound waves to create images of your heart. It provides your doctor with information about the size and shape of your heart and how well your hearts chambers and valves are working.   You may receive an ultrasound enhancing agent through an IV if needed to better visualize your heart during the echo. This procedure takes approximately one hour.  There are no restrictions for this procedure.  This will take place at [address] [facility] [address], [place] [address]  Please note: We ask at that you not bring children with you during ultrasound (echo/ vascular) testing. Due to room size and safety concerns, children are not allowed in the ultrasound rooms during exams. Our front office staff cannot provide observation of children in our lobby area while testing is being conducted. An adult accompanying a patient to their appointment will only be allowed in the ultrasound room at the discretion of the ultrasound technician under special circumstances. We apologize for any inconvenience.   ZIO XT- Long Term Monitor Instructions  Your physician has requested you wear a ZIO patch monitor for 14 days.  This is a single patch monitor. [employer] supplies  one patch monitor per enrollment. Additional stickers are not available. Please do not apply patch if you will be having a Nuclear Stress Test, Echocardiogram, Cardiac CT, MRI, or Chest Xray during the period you would be wearing the monitor. The patch cannot be worn during these tests. You cannot remove and re-apply the ZIO XT patch monitor.  Your ZIO patch monitor will be mailed 3 day USPS to your address on file. It may take 3-5 days to receive your monitor after you have been enrolled. Once you have received your monitor, please review the enclosed instructions. Your monitor has already been registered assigning a specific monitor serial number to you.  Billing and Patient Assistance Program Information  We have supplied [employer] with any of your insurance information on file for billing purposes.  [employer] offers a sliding scale Patient Assistance Program for patients that do not have insurance, or whose insurance does not completely cover the cost of the ZIO monitor.  You must apply for the Patient Assistance Program to qualify for this discounted rate.  To apply, please call [employer] at [phone], select option 4, select option 2, ask to apply for Patient Assistance Program. Meredeth will ask your household income, and how many people are in your household. They will quote your out-of-pocket cost based on that information. [employer] will also be able to set up a 49-month, interest-free payment plan if needed.  Applying the monitor    (Applied in clinic on 10/10/2024)    Hold abrader disc by orange tab. Rub abrader in 40 strokes over the upper left chest  as indicated in your monitor instructions.  Clean area with 4 enclosed alcohol pads. Let dry.  Apply patch as indicated in monitor instructions. Patch will be placed under collarbone on left side of chest with arrow pointing upward.  Rub patch adhesive wings for 2 minutes. Remove white label marked 1. Remove the white label marked 2. Rub patch  adhesive wings for 2 additional minutes.  While looking in a mirror, press and release button in center of patch. A small green light will flash 3-4 times. This will be your only indicator that the monitor has been turned on.   After Applying Monitor: Do not shower for the first 24 hours. You may shower after the first 24 hours. (Keep back toward the water; do not submerge monitor in water) Press the button if you feel a symptom. You will hear a small click. Record Date, Time and Symptom in the Patient Logbook.   After Completing 14 Days:  (Remove on 10/25/2024) When you are ready to remove the patch, follow instructions on the last 2 pages of Patient Logbook.  Stick patch monitor into the tabs at the bottom of the return box.  Place Patient Logbook in the blue and white box. Use locking tab on box and tape box closed securely. The blue and white box has prepaid postage on it. Please place it in the mailbox as soon as possible. Your physician should have your test results approximately 7-14 days after the monitor has been mailed back to [facility].   Troubleshooting: Call [facility] at [phone] if you have questions regarding your ZIO XT patch monitor.  Call them immediately if you see an orange light blinking on your monitor.  If your monitor falls off in less than 4 days, contact our Monitor department at [phone].  If your monitor becomes loose or falls off after 4 days call [employer] at [phone] for suggestions on securing your monitor.   Follow-Up: At [facility], you and your health needs are our priority.  As part of our continuing mission to provide you with exceptional heart care, our providers are all part of one team.  This team includes your primary Cardiologist (physician) and Advanced Practice Providers or APPs (Physician Assistants and Nurse Practitioners) who all work together to provide you with the care you need, when you need  it.  Your next appointment:  6 month(s)  Provider:  Caron Poser, MD    We recommend signing up for the patient portal called MyChart.  Sign up information is provided on this After Visit Summary.  MyChart is used to connect with patients for Virtual Visits (Telemedicine).  Patients are able to view lab/test results, encounter notes, upcoming appointments, etc.  Non-urgent messages can be sent to your provider as well.   To learn more about what you can do with MyChart, go to [email or web address].

## 2024-10-10 NOTE — Progress Notes (Unsigned)
" °  Cardiology Office Note   Date:  10/10/2024  ID:  Amanda Clay, DOB 1981-02-23, MRN [id number] PCP: Pia Kerney SQUIBB, MD  [facility] Providers Cardiologist:  Caron Poser, MD     History of Present Illness Amanda Clay is a 44 y.o. female PMH cerebral palsy with prior CVA, history of DVT, HTN who presents for follow-up of a small pericardial effusion.  Referred by facility for further care.  Last LDL on record here 123 09/2014.  Previous echo done at [facility] 03/2024 showed a small effusion.  Patient reports that this has been present before this echo as well.  She is not really having any persistent chest discomfort or syncope or any other high risk symptoms.  She does complain of intermittent palpitations, worse when laying down.  Relevant CVD History -TTE 03/2024 normal biventricular function without significant valve disease; small pericardial effusion - Monitor 02/2024 mean heart rate 78 bpm, rare ectopy, no arrhythmias - Cardiac catheterization 06/2023 normal coronary arteries with mildly elevated LVEDP to 21 mmHg   ROS: Pt denies any jaw pain, arm pain, syncope, presyncope, orthopnea, PND, or LE edema.  Studies Reviewed I have independently reviewed the patient's ECG, previous cardiac testing, previous medical records, previous blood work.  Physical Exam VS:  BP 102/76 (BP Location: Left Arm, Patient Position: Sitting, Cuff Size: Normal)   Pulse 99   Ht 4' 6 (1.372 m)   Wt 147 lb 9.6 oz (67 kg)   SpO2 98%   BMI 35.59 kg/m        Wt Readings from Last 3 Encounters:  10/10/24 147 lb 9.6 oz (67 kg)  03/20/20 120 lb (54.4 kg)  01/16/20 115 lb (52.2 kg)    GEN: No acute distress. NECK: No JVD; No carotid bruits. CARDIAC: RRR, no murmurs, rubs, gallops. RESPIRATORY:  Clear to auscultation. EXTREMITIES:  Warm and well-perfused. No edema.  ASSESSMENT AND PLAN Paroxysmal tachycardia Patient complains of occasional paroxysms of tachycardia.  Worse when laying  down.  No high risk symptoms such as syncope.  She does have a small pericardial effusion, though this has been present in the past per her report.  Plan: - Monitor to evaluate for arrhythmia  Small pericardial effusion Seen on echocardiogram 03/2024.  She is warm and perfusing today.  Wide pulse pressure.  Borderline sinus tachycardia, but no clear electrical alternans.  Does not have persistent chest pain or other obvious symptoms of pericarditis.  Plan: - Repeat echocardiogram to monitor size of effusion; if it seems to be enlarging rapidly, we will need to do a workup to try to find etiology and consider treatment        Dispo: RTC 6 months or sooner as needed  Signed, Caron Poser, MD  "

## 2024-10-26 ENCOUNTER — Ambulatory Visit

## 2024-10-31 ENCOUNTER — Ambulatory Visit: Admitting: Urology
